# Patient Record
Sex: Female | Born: 1955 | Race: Black or African American | Hispanic: No | Marital: Single | Smoking: Current every day smoker
Health system: Southern US, Community
[De-identification: ages and names within clinical notes are randomized; demographics above are authoritative.]

## PROBLEM LIST (undated history)

## (undated) DIAGNOSIS — IMO0001 Reserved for inherently not codable concepts without codable children: Secondary | ICD-10-CM

## (undated) DIAGNOSIS — R030 Elevated blood-pressure reading, without diagnosis of hypertension: Secondary | ICD-10-CM

## (undated) DIAGNOSIS — F17200 Nicotine dependence, unspecified, uncomplicated: Secondary | ICD-10-CM

## (undated) DIAGNOSIS — E785 Hyperlipidemia, unspecified: Secondary | ICD-10-CM

## (undated) DIAGNOSIS — I10 Essential (primary) hypertension: Secondary | ICD-10-CM

## (undated) HISTORY — DX: Nicotine dependence, unspecified, uncomplicated: F17.200

## (undated) HISTORY — DX: Elevated blood-pressure reading, without diagnosis of hypertension: R03.0

## (undated) HISTORY — DX: Reserved for inherently not codable concepts without codable children: IMO0001

## (undated) HISTORY — DX: Hyperlipidemia, unspecified: E78.5

---
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
## 2008-05-08 ENCOUNTER — Ambulatory Visit: Payer: Self-pay

## 2009-09-10 IMAGING — US US PELV - US TRANSVAGINAL
1 series · 17 of 25 positions shown · non-contrast
Comparison: none

REASON FOR EXAM: pelvic pain Hx of fibroids
COMMENTS:

[Series 1: us pelv - us transvaginal · 17 of 50 slices shown]
[im 1/50]
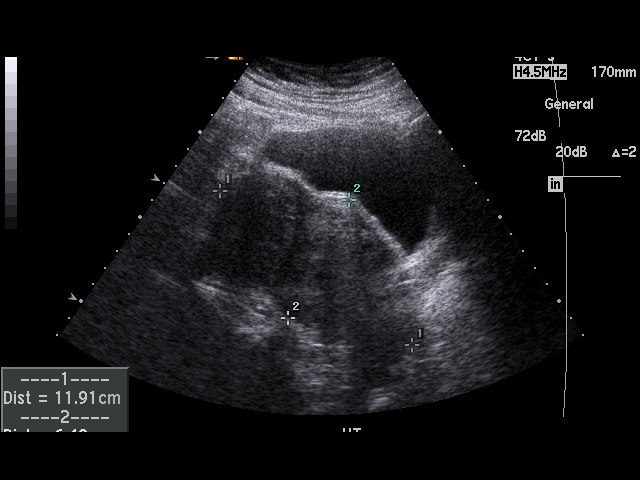
[im 5/50]
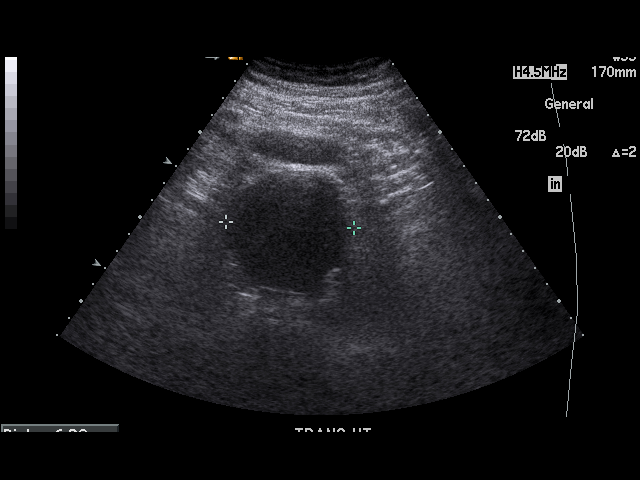
[im 7/50]
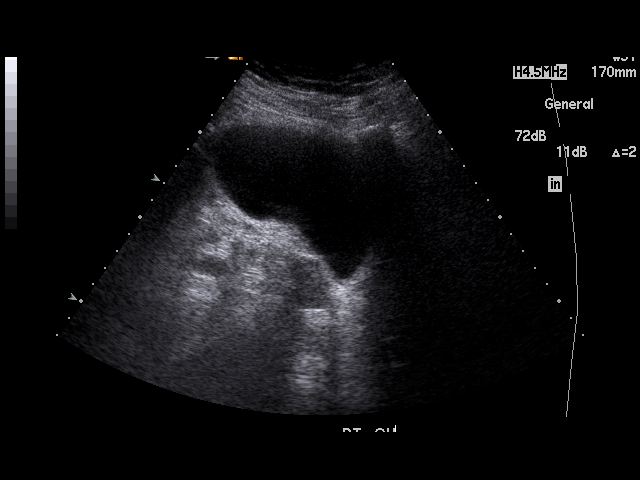
[im 11/50]
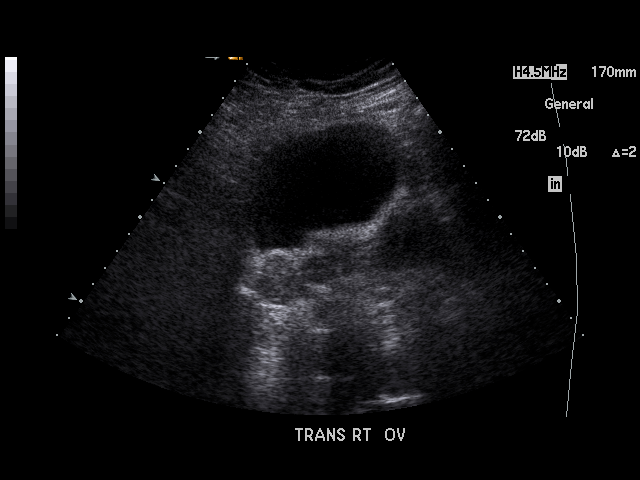
[im 13/50]
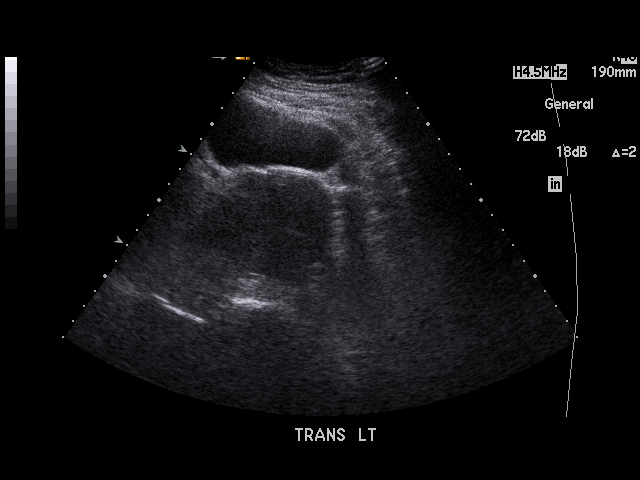
[im 17/50]
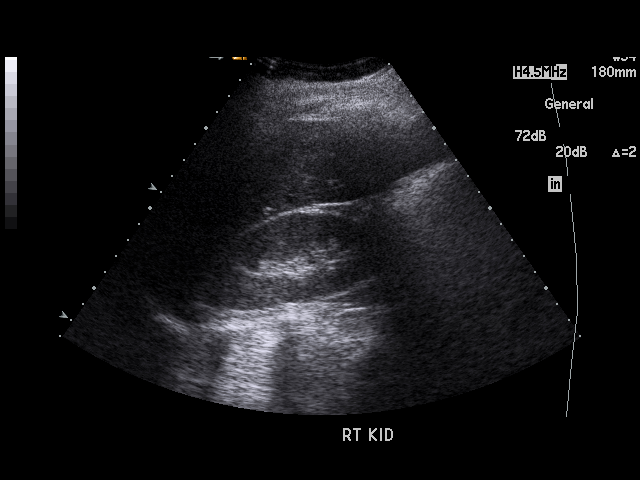
[im 19/50]
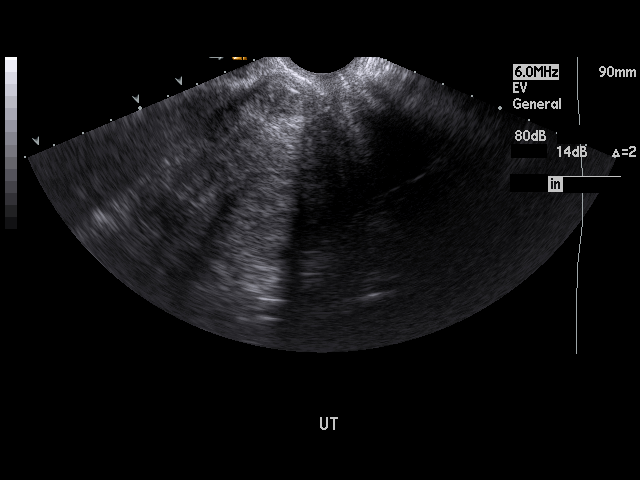
[im 23/50]
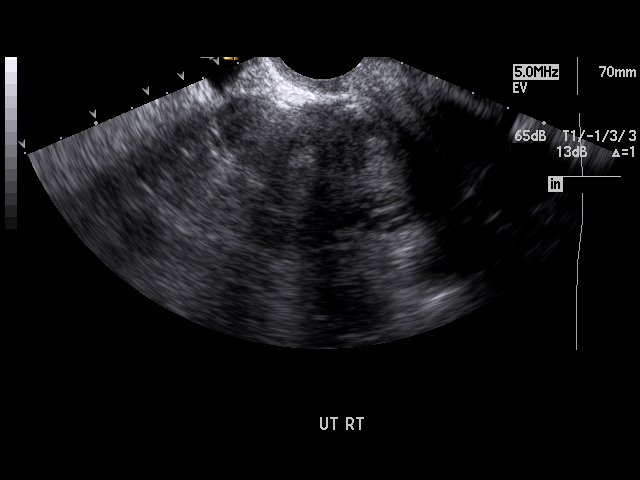
[im 25/50]
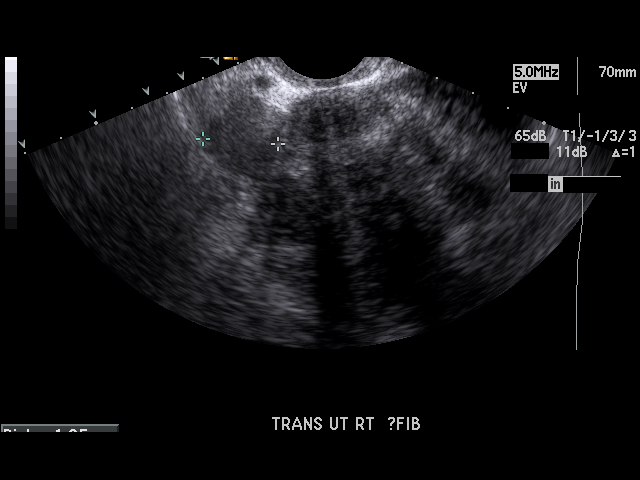
[im 27/50]
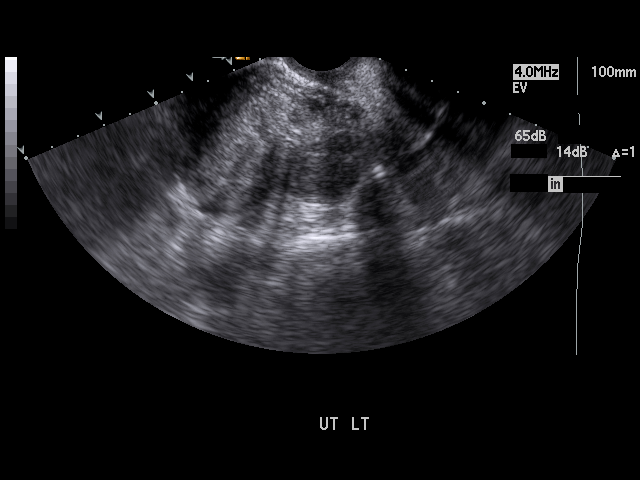
[im 31/50]
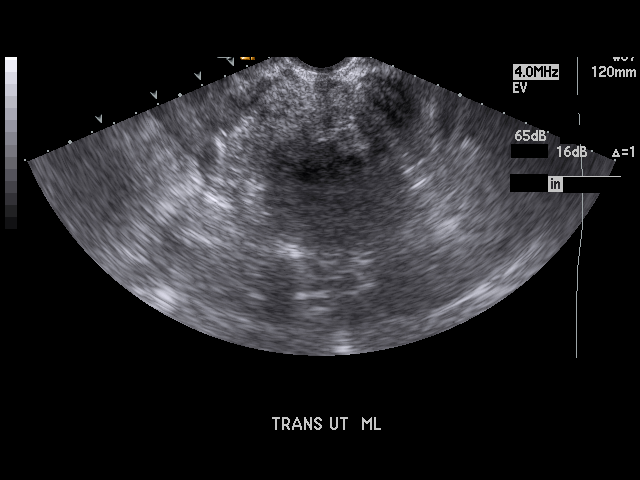
[im 33/50]
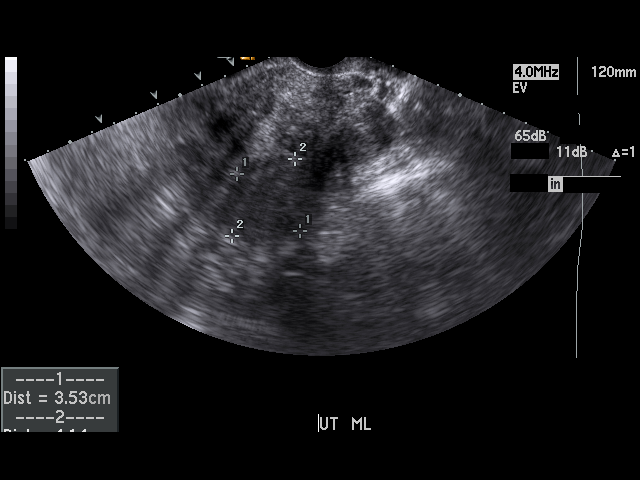
[im 37/50]
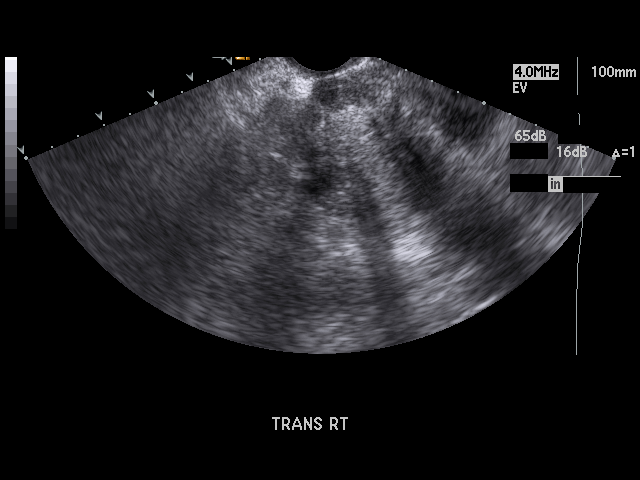
[im 39/50]
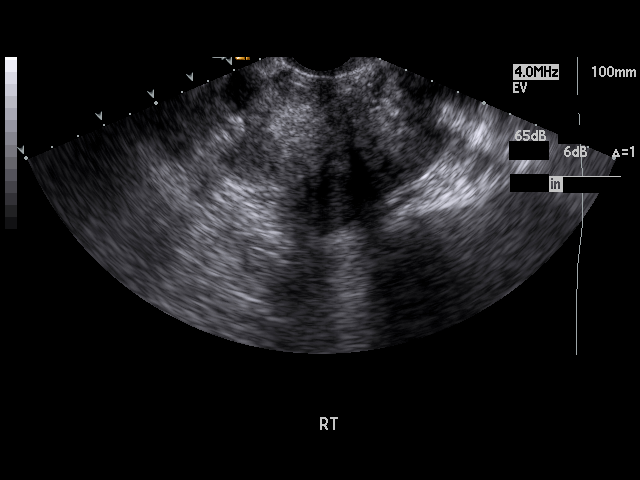
[im 43/50]
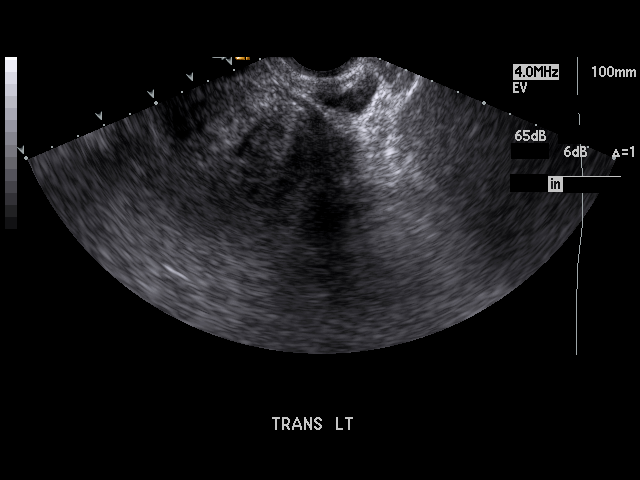
[im 45/50]
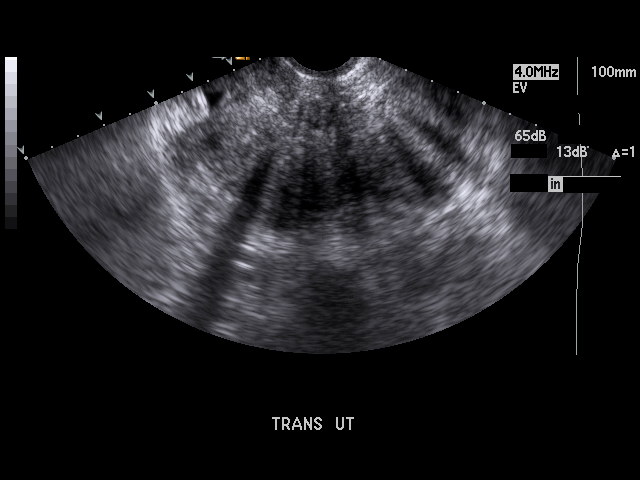
[im 50/50]
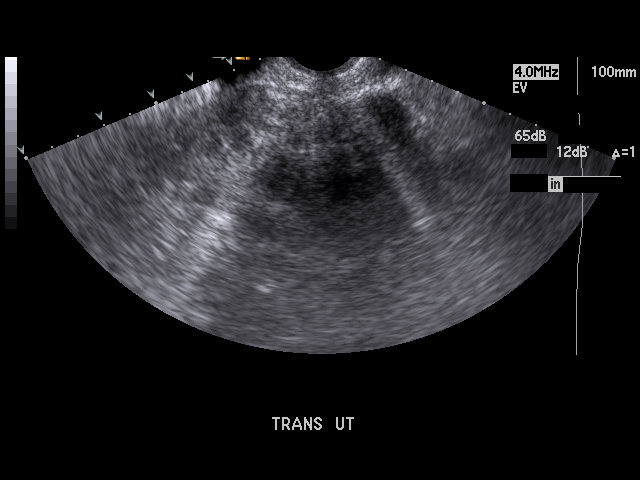

[17 of 25 positions shown; findings below may reference images not displayed]

PROCEDURE:     US  - US PELVIS MASS EXAM W/TRANSVAGI  - May 08, 2008  [DATE]

RESULT:     Transabdominal and endovaginal exam was performed.  The uterus
measures 11.91 cm x 8.67 cm x 6.43 cm. The endometrial stripe is not seen.
There are noted at least three uterine masses consistent with uterine
fibroids. The largest mass is in the fundal area anteriorly and measures
cm at maximum diameter. There appear to be additional masses measuring
cm at maximum diameter and 2.53 cm at maximum diameter consistent with
additional fibroids. The LEFT ovary is not identified. The RIGHT ovary is
not definitely seen but there is a hypoechoic area in the RIGHT adnexal area
measuring 2.55 cm at maximum diameter which may represent the RIGHT ovary
but this is not definite. No free fluid is seen in the pelvis. The
visualized portion of the urinary bladder is normal in appearance. The
kidneys show no hydronephrosis.
IMPRESSION: 1. There are multiple uterine masses consistent with uterine fibroids. The
largest is at the uterine fundus and measures 6.2 cm at maximum diameter.
2. The endometrial stripe is not identified in this patient.
3. The ovaries are not definitely seen.
4. There is no ascites.

## 2012-07-10 ENCOUNTER — Ambulatory Visit: Payer: Self-pay | Admitting: Physician Assistant

## 2012-08-31 ENCOUNTER — Ambulatory Visit: Payer: Self-pay | Admitting: Internal Medicine

## 2013-11-12 IMAGING — US US EXTREM UP VENOUS*L*
1 series · 14 of 24 positions shown · non-contrast
Comparison: none

REASON FOR EXAM: lt arm pain thrombophlebitis   eval DVT
COMMENTS:

PROCEDURE:     US  - US DOPPLER UP EXTR LEFT  - July 10, 2012 [DATE]
RESULT:     Comparison: None
TECHNIQUE: Multiple gray-scale, color-flow Doppler, and spectral waveform
tracings of the left internal jugular vein, brachiocephalic vein, and
proximal deep veins of the left upper extremity from the antecubital fossa
to the brachiocephalic vein are presented for review.

[Series 1: us extrem up venous*left* · 0.09mm/px · 14 of 39 slices shown]
[im 1/39]
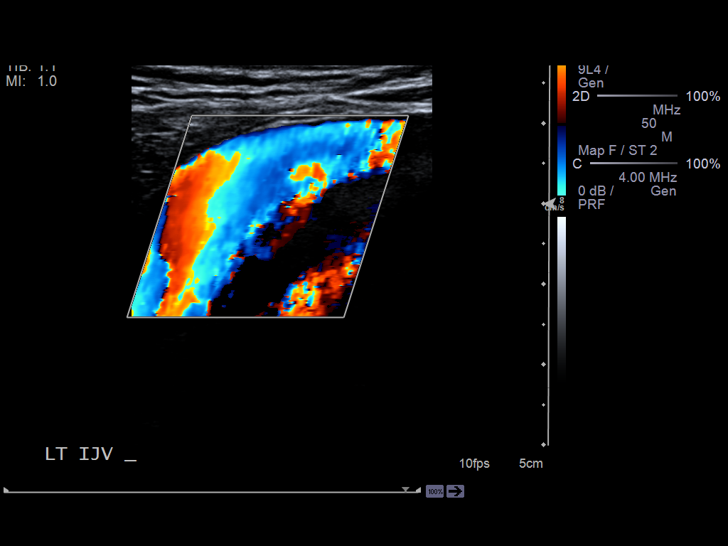
[im 4/39]
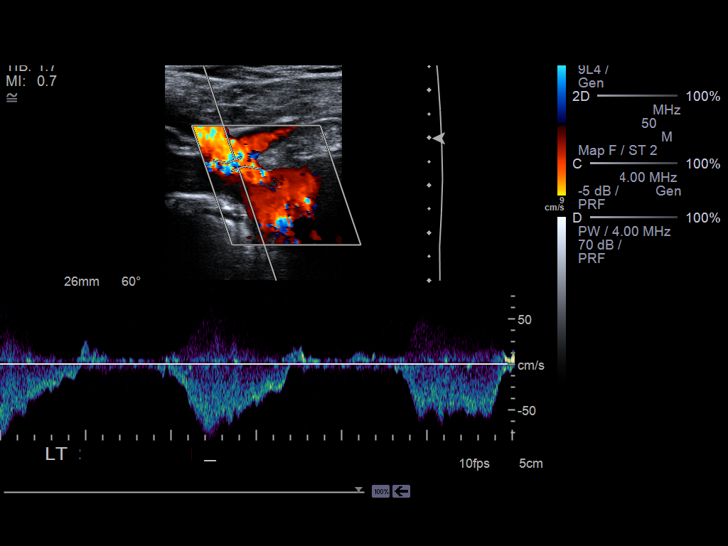
[im 7/39]
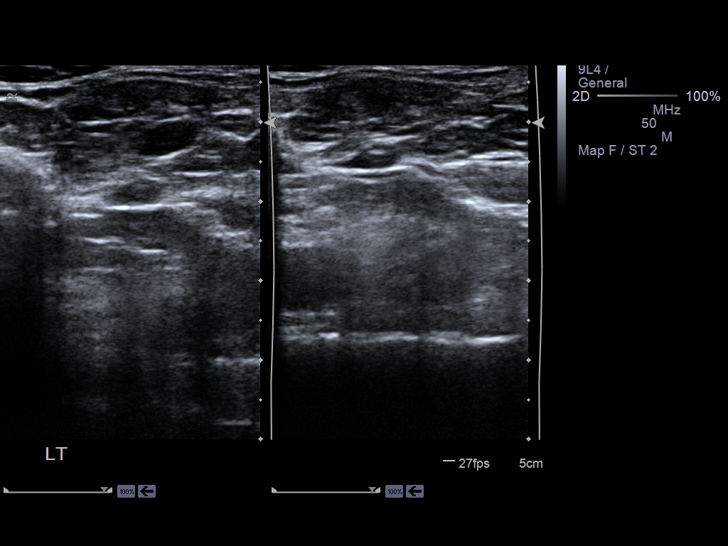
[im 10/39]
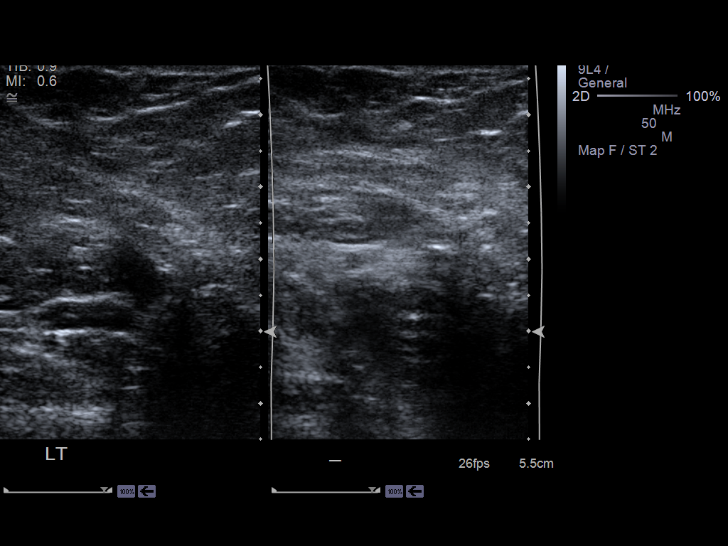
[im 12/39]
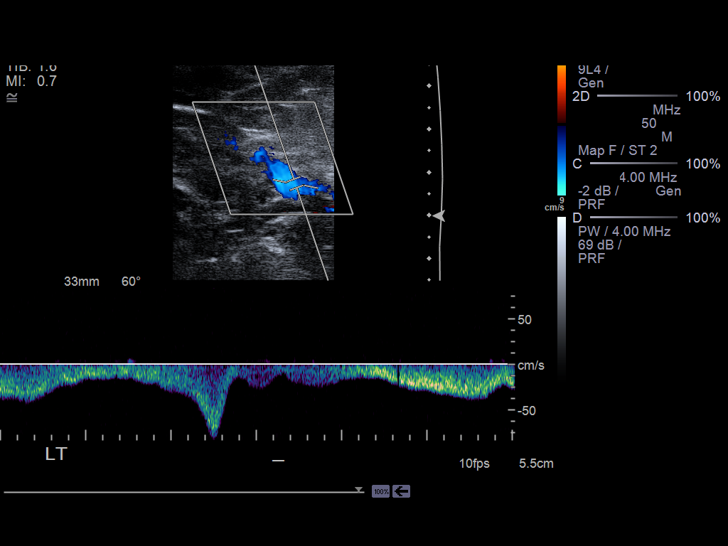
[im 15/39]
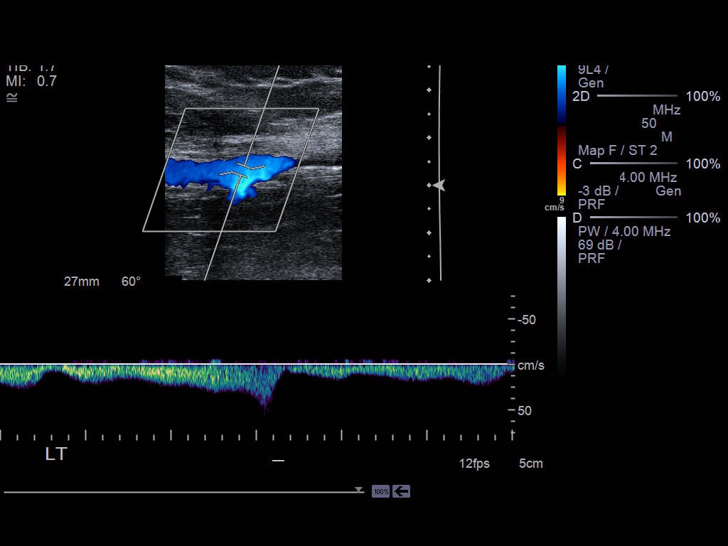
[im 19/39]
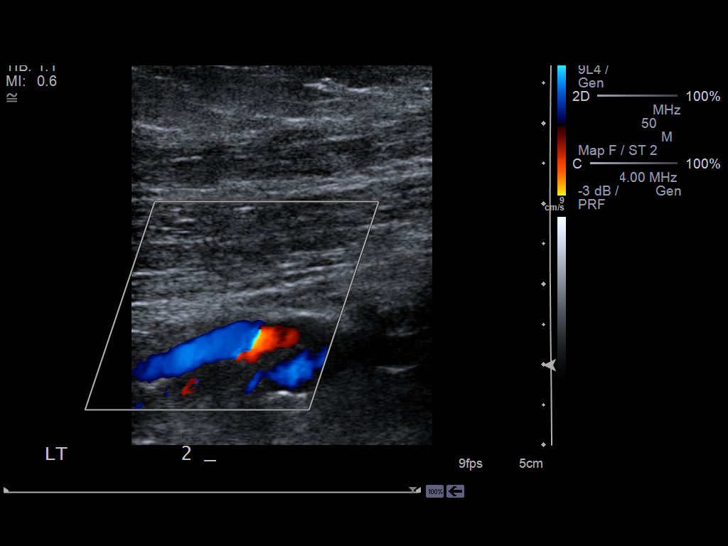
[im 20/39]
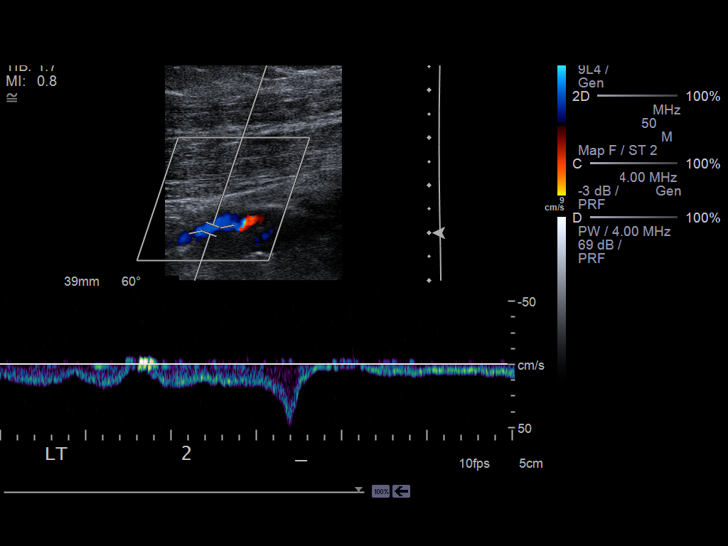
[im 24/39]
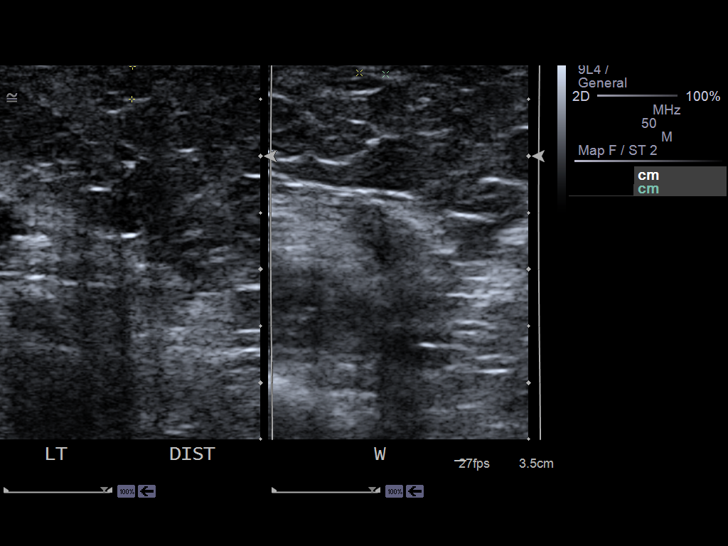
[im 27/39]
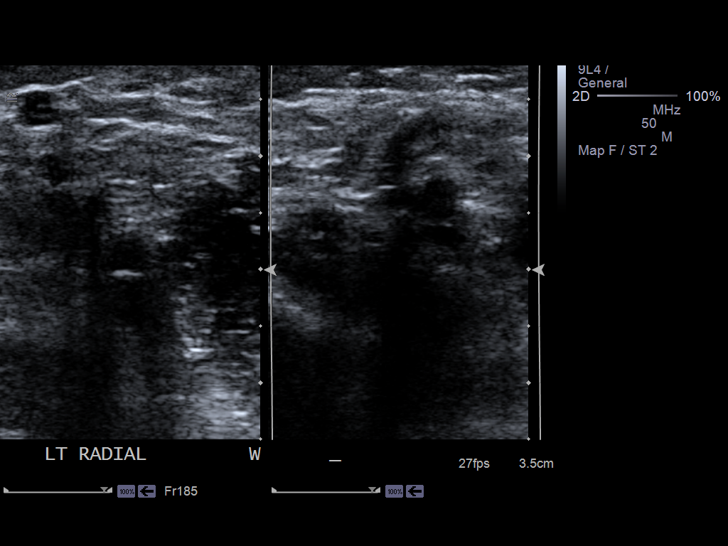
[im 30/39]
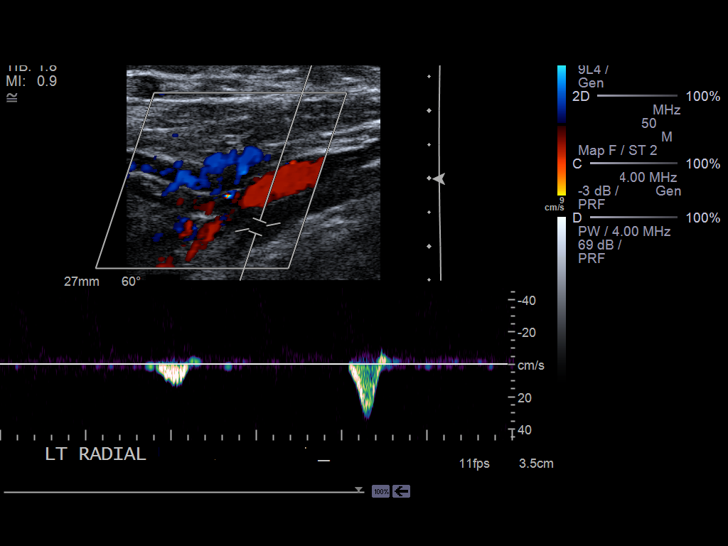
[im 32/39]
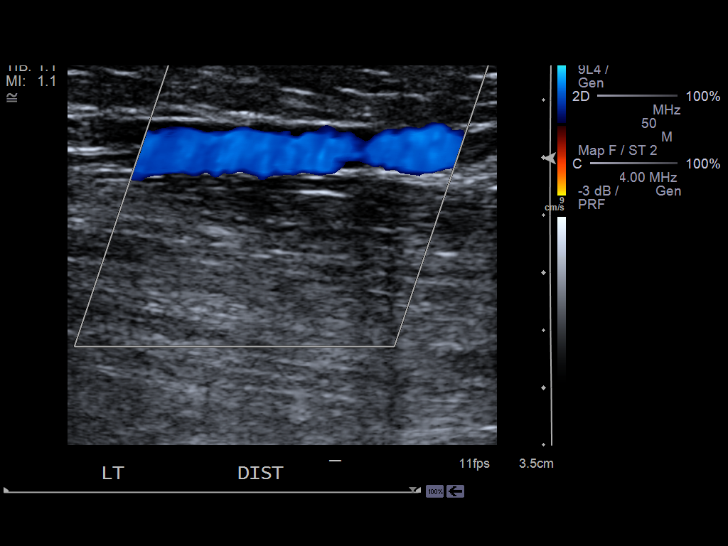
[im 35/39]
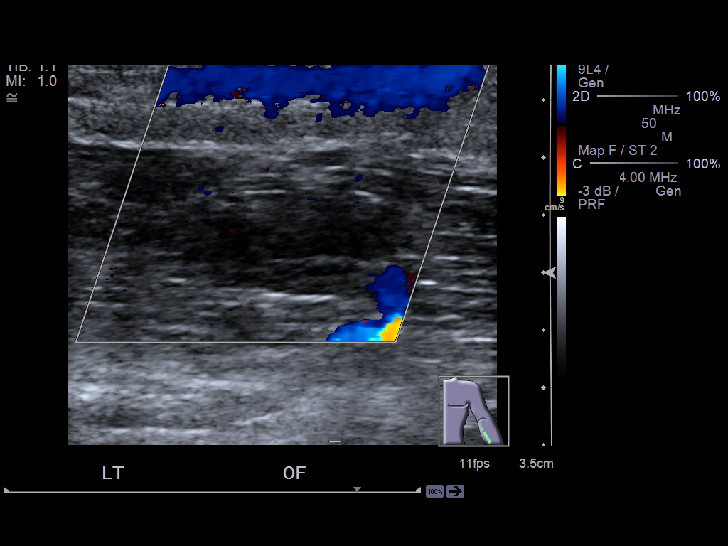
[im 39/39]
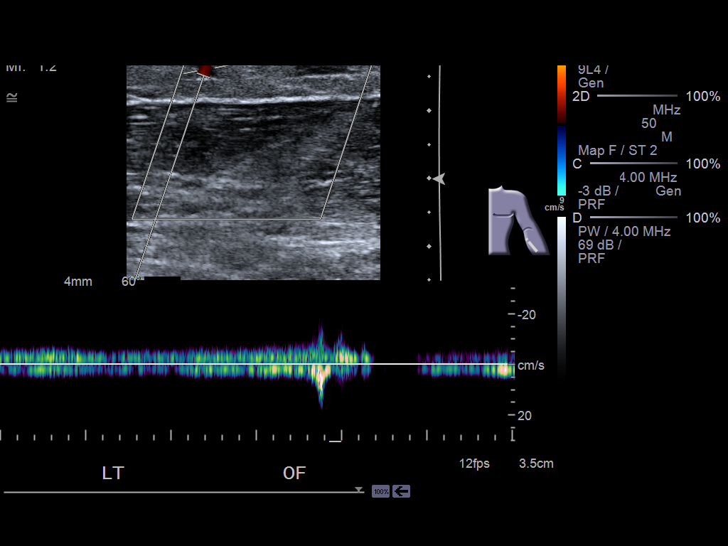

[14 of 24 positions shown; findings below may reference images not displayed]

FINDINGS: There is normal blood flow within the left internal jugular vein, visualized
portions of the brachiocephalic vein, and left upper extremity deep venous
system from the antecubital fossa to the brachiocephalic vein.

Limited images were obtained of the soft tissues of the medial aspect of the
forearm in the region of reported palpable amount he. No mass is identified.
IMPRESSION: No sonographic evidence of deep venous thrombosis in the left upper
extremity.

[REDACTED]

## 2014-04-05 DIAGNOSIS — R030 Elevated blood-pressure reading, without diagnosis of hypertension: Secondary | ICD-10-CM

## 2014-04-05 HISTORY — DX: Elevated blood-pressure reading, without diagnosis of hypertension: R03.0

## 2016-01-26 ENCOUNTER — Ambulatory Visit (INDEPENDENT_AMBULATORY_CARE_PROVIDER_SITE_OTHER): Payer: 59 | Admitting: Internal Medicine

## 2016-01-26 ENCOUNTER — Encounter: Payer: Self-pay | Admitting: Internal Medicine

## 2016-01-26 VITALS — BP 140/84 | HR 68 | Resp 16 | Ht 64.0 in | Wt 177.6 lb

## 2016-01-26 DIAGNOSIS — F17200 Nicotine dependence, unspecified, uncomplicated: Secondary | ICD-10-CM | POA: Diagnosis not present

## 2016-01-26 DIAGNOSIS — Z6830 Body mass index (BMI) 30.0-30.9, adult: Secondary | ICD-10-CM | POA: Diagnosis not present

## 2016-01-26 MED ORDER — VARENICLINE TARTRATE 1 MG PO TABS: 1.0000 mg | ORAL_TABLET | Freq: Two times a day (BID) | ORAL | 3 refills | Status: DC

## 2016-01-26 MED ORDER — VARENICLINE TARTRATE 0.5 MG X 11 & 1 MG X 42 PO MISC: ORAL | 0 refills | Status: DC

## 2016-01-26 NOTE — Progress Notes (Signed)
    Date:  01/26/2016   Name:  Shannon Barton   DOB:  [birth year]   MRN:  [id number]   Chief Complaint: Nicotine Dependence (Quit smoking in July 2017 but started back and now work needs her to reach a goal in order to get benefits credit. ) and Weight Loss (Needs to improve BMI for work benefits ) Nicotine Dependence  Presents for follow-up visit. Symptoms are negative for fatigue. Her urge triggers include company of smokers. The symptoms have been improving. She smokes 1 pack of cigarettes per day. Compliance with prior treatments has been good.   Overweight - needs to meet BMI goal of < 29.8.  She has already started working on weight loss and has lost a few pounds. She has a sedentary job and tends to snack so has cut out chips.  Wt Readings from Last 3 Encounters:  01/26/16 177 lb 9.6 oz (80.6 kg)     Review of Systems  Constitutional: Negative for chills, fatigue and fever.  Respiratory: Negative for cough, chest tightness, shortness of breath and wheezing.   Cardiovascular: Positive for leg swelling. Negative for chest pain and palpitations.  Gastrointestinal: Negative for abdominal pain.  Musculoskeletal: Negative for arthralgias and gait problem.    There are no active problems to display for this patient.   Prior to Admission medications   Not on File    No Known Allergies  Past Surgical History:  Procedure Laterality Date  . TONSILLECTOMY  1963    Social History  Substance Use Topics  . Smoking status: Current Every Day Smoker    Packs/day: 1.00  . Smokeless tobacco: Never Used  . Alcohol use No     Medication list has been reviewed and updated.   Physical Exam  Constitutional: She is oriented to person, place, and time. She appears well-developed. No distress.  HENT:  Head: Normocephalic and atraumatic.  Cardiovascular: Normal rate, regular rhythm and normal heart sounds.   Pulmonary/Chest: Effort normal and breath sounds normal. No respiratory  distress.  Musculoskeletal: Normal range of motion. She exhibits edema (trace ankle).  Neurological: She is alert and oriented to person, place, and time.  Skin: Skin is warm and dry. No rash noted.  Psychiatric: She has a normal mood and affect. Her speech is normal and behavior is normal. Thought content normal.  Nursing note and vitals reviewed.   BP 140/84   Pulse 68   Resp 16   Ht 5\' 4"  (1.626 m)   Wt 177 lb 9.6 oz (80.6 kg)   SpO2 98%   BMI 30.48 kg/m   Assessment and Plan: 1. Tobacco use disorder Rx given with counselling and savings card - Nurse to provide smoking / tobacco cessation education - varenicline (CHANTIX STARTING MONTH PAK) 0.5 MG X 11 & 1 MG X 42 tablet; Take one 0.5 mg tablet by mouth once daily for 3 days, then increase to one 0.5 mg tablet twice daily for 4 days, then increase to one 1 mg tablet twice daily.  Dispense: 53 tablet; Refill: 0 - varenicline (CHANTIX) 1 MG tablet; Take 1 tablet (1 mg total) by mouth 2 (two) times daily.  Dispense: 60 tablet; Refill: 3  2. BMI 30.0-30.9,adult Goal to lose 5 lbs to achieve BMI < 29.8  3. HM Patient urged to schedule CPX  Halina Maidens, MD [facility]  01/26/2016

## 2016-06-13 ENCOUNTER — Encounter: Payer: Self-pay | Admitting: Internal Medicine

## 2016-06-13 DIAGNOSIS — Z6830 Body mass index (BMI) 30.0-30.9, adult: Secondary | ICD-10-CM | POA: Insufficient documentation

## 2016-06-13 DIAGNOSIS — F17200 Nicotine dependence, unspecified, uncomplicated: Secondary | ICD-10-CM | POA: Insufficient documentation

## 2016-06-14 ENCOUNTER — Ambulatory Visit (INDEPENDENT_AMBULATORY_CARE_PROVIDER_SITE_OTHER): Payer: 59 | Admitting: Internal Medicine

## 2016-06-14 ENCOUNTER — Encounter: Payer: Self-pay | Admitting: Internal Medicine

## 2016-06-14 VITALS — BP 130/76 | HR 92 | Ht 64.0 in | Wt 176.4 lb

## 2016-06-14 DIAGNOSIS — Z1211 Encounter for screening for malignant neoplasm of colon: Secondary | ICD-10-CM | POA: Diagnosis not present

## 2016-06-14 DIAGNOSIS — F17200 Nicotine dependence, unspecified, uncomplicated: Secondary | ICD-10-CM

## 2016-06-14 DIAGNOSIS — Z0000 Encounter for general adult medical examination without abnormal findings: Secondary | ICD-10-CM

## 2016-06-14 DIAGNOSIS — Z1231 Encounter for screening mammogram for malignant neoplasm of breast: Secondary | ICD-10-CM | POA: Diagnosis not present

## 2016-06-14 DIAGNOSIS — Z1239 Encounter for other screening for malignant neoplasm of breast: Secondary | ICD-10-CM

## 2016-06-14 DIAGNOSIS — Z1159 Encounter for screening for other viral diseases: Secondary | ICD-10-CM | POA: Insufficient documentation

## 2016-06-14 LAB — POCT URINALYSIS DIPSTICK
BILIRUBIN UA: NEGATIVE
GLUCOSE UA: NEGATIVE
Ketones, UA: NEGATIVE
Leukocytes, UA: NEGATIVE
Nitrite, UA: NEGATIVE
Protein, UA: 300
RBC UA: NEGATIVE
SPEC GRAV UA: 1.015
UROBILINOGEN UA: 0.2
pH, UA: 6

## 2016-06-14 NOTE — Progress Notes (Signed)
Date:  06/14/2016   Name:  Shannon Barton   DOB:  1956-02-23   MRN:  [id number]   Chief Complaint: Annual Exam (w/ pap. She does not have GYN doctor. Wants Mammo and Colonoscopy done.) Shannon Barton is a 61 y.o. female who presents today for her Complete Annual Exam. She feels well. She reports exercising walking. She reports she is sleeping well. She has no breast complaints. She has cut back on smoking - less than one per day now.  Wt Readings from Last 3 Encounters:  06/14/16 176 lb 6.4 oz (80 kg)  01/26/16 177 lb 9.6 oz (80.6 kg)     Review of Systems  Constitutional: Negative for chills, fatigue and fever.  HENT: Negative for congestion, hearing loss, tinnitus, trouble swallowing and voice change.   Eyes: Negative for visual disturbance.  Respiratory: Negative for cough, chest tightness, shortness of breath and wheezing.   Cardiovascular: Negative for chest pain, palpitations and leg swelling.  Gastrointestinal: Negative for abdominal pain, constipation, diarrhea and vomiting.  Endocrine: Negative for polydipsia and polyuria.  Genitourinary: Negative for dysuria, frequency, genital sores, vaginal bleeding and vaginal discharge.  Musculoskeletal: Negative for arthralgias, gait problem and joint swelling.  Skin: Negative for color change and rash.  Neurological: Negative for dizziness, tremors, light-headedness and headaches.  Hematological: Negative for adenopathy. Does not bruise/bleed easily.  Psychiatric/Behavioral: Negative for dysphoric mood and sleep disturbance. The patient is not nervous/anxious.     Patient Active Problem List   Diagnosis Date Noted  . Tobacco use disorder 06/13/2016  . BMI 30.0-30.9,adult 06/13/2016    Prior to Admission medications   Medication Sig Start Date End Date Taking? Authorizing Provider  varenicline (CHANTIX STARTING MONTH PAK) 0.5 MG X 11 & 1 MG X 42 tablet Take one 0.5 mg tablet by mouth once daily for 3 days, then  increase to one 0.5 mg tablet twice daily for 4 days, then increase to one 1 mg tablet twice daily. Patient not taking: Reported on 06/14/2016 01/26/16   Glean Hess, MD  varenicline (CHANTIX) 1 MG tablet Take 1 tablet (1 mg total) by mouth 2 (two) times daily. Patient not taking: Reported on 06/14/2016 01/26/16   Glean Hess, MD    No Known Allergies  Past Surgical History:  Procedure Laterality Date  . TONSILLECTOMY  1963    Social History  Substance Use Topics  . Smoking status: Current Every Day Smoker    Packs/day: 1.00  . Smokeless tobacco: Never Used  . Alcohol use No     Medication list has been reviewed and updated.   Physical Exam  Constitutional: She is oriented to person, place, and time. She appears well-developed and well-nourished. No distress.  HENT:  Head: Normocephalic and atraumatic.  Right Ear: Tympanic membrane and ear canal normal.  Left Ear: Tympanic membrane and ear canal normal.  Nose: Right sinus exhibits no maxillary sinus tenderness. Left sinus exhibits no maxillary sinus tenderness.  Mouth/Throat: Uvula is midline and oropharynx is clear and moist.  Eyes: Conjunctivae and EOM are normal. Right eye exhibits no discharge. Left eye exhibits no discharge. No scleral icterus.  Neck: Normal range of motion. Carotid bruit is not present. No erythema present. No thyromegaly present.  Cardiovascular: Normal rate, regular rhythm, normal heart sounds and normal pulses.   Pulmonary/Chest: Effort normal. No respiratory distress. She has no wheezes. Right breast exhibits no mass, no nipple discharge, no skin change and no tenderness. Left breast exhibits no  mass, no nipple discharge, no skin change and no tenderness.  Abdominal: Soft. Bowel sounds are normal. There is no hepatosplenomegaly. There is no tenderness. There is no CVA tenderness.  Genitourinary: Vagina normal and uterus normal. There is no tenderness, lesion or injury on the right labia. There  is no tenderness, lesion or injury on the left labia. Cervix exhibits no motion tenderness, no discharge and no friability. Right adnexum displays no mass, no tenderness and no fullness. Left adnexum displays no mass, no tenderness and no fullness.  Musculoskeletal: Normal range of motion.  Lymphadenopathy:    She has no cervical adenopathy.    She has no axillary adenopathy.  Neurological: She is alert and oriented to person, place, and time. She has normal reflexes. No cranial nerve deficit or sensory deficit.  Skin: Skin is warm, dry and intact. No rash noted.  Psychiatric: She has a normal mood and affect. Her speech is normal and behavior is normal. Thought content normal.  Nursing note and vitals reviewed.   BP 130/76   Pulse 92   Ht 5\' 4"  (1.626 m)   Wt 176 lb 6.4 oz (80 kg)   SpO2 99%   BMI 30.28 kg/m   Assessment and Plan: 1. Annual physical exam Recommend healthy diet and regular exercise - CBC with Differential/Platelet - Comprehensive metabolic panel - Lipid panel - TSH - POCT urinalysis dipstick - Pap IG and HPV (high risk) DNA detection  2. Breast cancer screening - MM DIGITAL SCREENING BILATERAL; Future  3. Tobacco use disorder Congratulated on almost quitting  4. Colon cancer screening - Ambulatory referral to Gastroenterology   No orders of the defined types were placed in this encounter.   Halina Maidens, MD [facility]  06/14/2016

## 2016-06-14 NOTE — Patient Instructions (Signed)
Breast Self-Awareness Breast self-awareness means being familiar with how your breasts look and feel. It involves checking your breasts regularly and reporting any changes to your health care provider. Practicing breast self-awareness is important. A change in your breasts can be a sign of a serious medical problem. Being familiar with how your breasts look and feel allows you to find any problems early, when treatment is more likely to be successful. All women should practice breast self-awareness, including women who have had breast implants. How to do a breast self-exam One way to learn what is normal for your breasts and whether your breasts are changing is to do a breast self-exam. To do a breast self-exam: Look for Changes   1. Remove all the clothing above your waist. 2. Stand in front of a mirror in a room with good lighting. 3. Put your hands on your hips. 4. Push your hands firmly downward. 5. Compare your breasts in the mirror. Look for differences between them (asymmetry), such as:  Differences in shape.  Differences in size.  Puckers, dips, and bumps in one breast and not the other. 6. Look at each breast for changes in your skin, such as:  Redness.  Scaly areas. 7. Look for changes in your nipples, such as:  Discharge.  Bleeding.  Dimpling.  Redness.  A change in position. Feel for Changes   Carefully feel your breasts for lumps and changes. It is best to do this while lying on your back on the floor and again while sitting or standing in the shower or tub with soapy water on your skin. Feel each breast in the following way:  Place the arm on the side of the breast you are examining above your head.  Feel your breast with the other hand.  Start in the nipple area and make  inch (2 cm) overlapping circles to feel your breast. Use the pads of your three middle fingers to do this. Apply light pressure, then medium pressure, then firm pressure. The light pressure  will allow you to feel the tissue closest to the skin. The medium pressure will allow you to feel the tissue that is a little deeper. The firm pressure will allow you to feel the tissue close to the ribs.  Continue the overlapping circles, moving downward over the breast until you feel your ribs below your breast.  Move one finger-width toward the center of the body. Continue to use the  inch (2 cm) overlapping circles to feel your breast as you move slowly up toward your collarbone.  Continue the up and down exam using all three pressures until you reach your armpit. Write Down What You Find   Write down what is normal for each breast and any changes that you find. Keep a written record with breast changes or normal findings for each breast. By writing this information down, you do not need to depend only on memory for size, tenderness, or location. Write down where you are in your menstrual cycle, if you are still menstruating. If you are having trouble noticing differences in your breasts, do not get discouraged. With time you will become more familiar with the variations in your breasts and more comfortable with the exam. How often should I examine my breasts? Examine your breasts every month. If you are breastfeeding, the best time to examine your breasts is after a feeding or after using a breast pump. If you menstruate, the best time to examine your breasts is 5-7 days  after your period is over. During your period, your breasts are lumpier, and it may be more difficult to notice changes. When should I see my health care provider? See your health care provider if you notice:  A change in shape or size of your breasts or nipples.  A change in the skin of your breast or nipples, such as a reddened or scaly area.  Unusual discharge from your nipples.  A lump or thick area that was not there before.  Pain in your breasts.  Anything that concerns you. This information is not intended to  replace advice given to you by your health care provider. Make sure you discuss any questions you have with your health care provider. Document Released: 03/22/2005 Document Revised: 08/28/2015 Document Reviewed: 02/09/2015 Elsevier Interactive Patient Education  2017 [employer].

## 2016-06-17 LAB — PAP IG AND HPV HIGH-RISK
HPV, high-risk: NEGATIVE
PAP SMEAR COMMENT: 0

## 2016-06-18 LAB — COMPREHENSIVE METABOLIC PANEL
ALBUMIN: 4.1 g/dL (ref 3.5–5.5)
ALT: 11 IU/L (ref 0–32)
AST: 18 IU/L (ref 0–40)
Albumin/Globulin Ratio: 1.2 (ref 1.2–2.2)
Alkaline Phosphatase: 79 IU/L (ref 39–117)
BUN / CREAT RATIO: 18 (ref 9–23)
BUN: 13 mg/dL (ref 6–24)
Bilirubin Total: <0.2 mg/dL (ref 0.0–1.2)
CALCIUM: 9.8 mg/dL (ref 8.7–10.2)
CHLORIDE: 98 mmol/L (ref 96–106)
CO2: 23 mmol/L (ref 18–29)
Creatinine, Ser: 0.71 mg/dL (ref 0.57–1.00)
GFR, EST AFRICAN AMERICAN: 108 mL/min/{1.73_m2} (ref >59)
GFR, EST NON AFRICAN AMERICAN: 94 mL/min/{1.73_m2} (ref >59)
Globulin, Total: 3.5 g/dL (ref 1.5–4.5)
Glucose: 106 mg/dL — ABNORMAL HIGH (ref 65–99)
POTASSIUM: 4.8 mmol/L (ref 3.5–5.2)
Sodium: 140 mmol/L (ref 134–144)
TOTAL PROTEIN: 7.6 g/dL (ref 6.0–8.5)

## 2016-06-18 LAB — CBC WITH DIFFERENTIAL/PLATELET
BASOS: 0 %
Basophils Absolute: 0 10*3/uL (ref 0.0–0.2)
EOS (ABSOLUTE): 0.2 10*3/uL (ref 0.0–0.4)
EOS: 2 %
HEMATOCRIT: 43 % (ref 34.0–46.6)
HEMOGLOBIN: 14.4 g/dL (ref 11.1–15.9)
IMMATURE GRANS (ABS): 0 10*3/uL (ref 0.0–0.1)
IMMATURE GRANULOCYTES: 0 %
LYMPHS: 23 %
Lymphocytes Absolute: 2.5 10*3/uL (ref 0.7–3.1)
MCH: 29.3 pg (ref 26.6–33.0)
MCHC: 33.5 g/dL (ref 31.5–35.7)
MCV: 88 fL (ref 79–97)
MONOCYTES: 9 %
Monocytes Absolute: 0.9 10*3/uL (ref 0.1–0.9)
NEUTROS ABS: 7.1 10*3/uL — AB (ref 1.4–7.0)
NEUTROS PCT: 66 %
PLATELETS: 318 10*3/uL (ref 150–379)
RBC: 4.91 x10E6/uL (ref 3.77–5.28)
RDW: 13.5 % (ref 12.3–15.4)
WBC: 10.8 10*3/uL (ref 3.4–10.8)

## 2016-06-18 LAB — TSH: TSH: 0.835 u[IU]/mL (ref 0.450–4.500)

## 2016-06-18 LAB — LIPID PANEL
Chol/HDL Ratio: 6.6 ratio units — ABNORMAL HIGH (ref 0.0–4.4)
Cholesterol, Total: 238 mg/dL — ABNORMAL HIGH (ref 100–199)
HDL: 36 mg/dL — AB (ref >39)
LDL Calculated: 186 mg/dL — ABNORMAL HIGH (ref 0–99)
Triglycerides: 80 mg/dL (ref 0–149)
VLDL CHOLESTEROL CAL: 16 mg/dL (ref 5–40)

## 2016-06-28 ENCOUNTER — Other Ambulatory Visit: Payer: Self-pay | Admitting: Internal Medicine

## 2016-06-28 ENCOUNTER — Telehealth: Payer: Self-pay

## 2016-06-28 MED ORDER — ATORVASTATIN CALCIUM 10 MG PO TABS: 10.0000 mg | ORAL_TABLET | Freq: Every day | ORAL | 3 refills | Status: DC

## 2016-06-28 NOTE — Telephone Encounter (Signed)
Spoke to pt about labs- would like to begin cholesterol medication and send to pharmacy- [facility] in [place]. Pt sent to front to schedule 4 month Follow up appt.

## 2016-06-28 NOTE — Telephone Encounter (Signed)
Done

## 2016-06-28 NOTE — Telephone Encounter (Signed)
Pt informed

## 2016-10-26 ENCOUNTER — Ambulatory Visit: Payer: 59 | Admitting: Internal Medicine

## 2016-11-10 ENCOUNTER — Ambulatory Visit (INDEPENDENT_AMBULATORY_CARE_PROVIDER_SITE_OTHER): Payer: 59 | Admitting: Internal Medicine

## 2016-11-10 VITALS — BP 132/82 | HR 65 | Ht 64.5 in | Wt 170.0 lb

## 2016-11-10 DIAGNOSIS — F17200 Nicotine dependence, unspecified, uncomplicated: Secondary | ICD-10-CM

## 2016-11-10 DIAGNOSIS — E782 Mixed hyperlipidemia: Secondary | ICD-10-CM | POA: Diagnosis not present

## 2016-11-10 DIAGNOSIS — R030 Elevated blood-pressure reading, without diagnosis of hypertension: Secondary | ICD-10-CM | POA: Diagnosis not present

## 2016-11-10 MED ORDER — SIMVASTATIN 10 MG PO TABS: 10.0000 mg | ORAL_TABLET | Freq: Every day | ORAL | 3 refills | Status: DC

## 2016-11-10 NOTE — Progress Notes (Signed)
Date:  11/10/2016   Name:  Shannon Barton   DOB:  05-Jan-1956   MRN:  [id number]   Chief Complaint: Hyperlipidemia (Patient quit taking atorvastatin 1 month after starting it. States she stopped it because she had "griping" cramps in her sides. ) and Hypertension Hyperlipidemia  This is a chronic problem. Recent lipid tests were reviewed and are high. Pertinent negatives include no chest pain or shortness of breath. Compliance problems include medication side effects (had RUQ abd pain on atorvastatin).   Hypertension  This is a new problem. The current episode started 1 to 4 weeks ago. Progression since onset: elevated BP at work biometric screening. Pertinent negatives include no chest pain, headaches, palpitations or shortness of breath.  Nicotine Dependence  Presents for follow-up visit. Symptoms are negative for fatigue. Her urge triggers include company of smokers. The symptoms have been improving. She smokes < 1/2 a pack (smoking only one or two cigarettes every other day) of cigarettes per day. Compliance with prior treatments: never tried chantix - was afraid of side effects.      Review of Systems  Constitutional: Negative for chills, fatigue, fever and unexpected weight change.  Respiratory: Negative for chest tightness, shortness of breath and wheezing.   Cardiovascular: Negative for chest pain, palpitations and leg swelling.  Gastrointestinal: Negative for abdominal distention, abdominal pain, constipation and diarrhea.  Neurological: Negative for dizziness and headaches.  Psychiatric/Behavioral: Negative for dysphoric mood. The patient is not nervous/anxious.     Patient Active Problem List   Diagnosis Date Noted  . Mixed hyperlipidemia 11/10/2016  . Elevated BP without diagnosis of hypertension 11/10/2016  . Need for hepatitis C screening test 06/14/2016  . Tobacco use disorder 06/13/2016  . BMI 30.0-30.9,adult 06/13/2016    Prior to Admission medications     Medication Sig Start Date End Date Taking? Authorizing Provider  atorvastatin (LIPITOR) 10 MG tablet Take 1 tablet (10 mg total) by mouth daily. Patient not taking: Reported on 11/10/2016 06/28/16   Glean Hess, MD  varenicline (CHANTIX STARTING MONTH PAK) 0.5 MG X 11 & 1 MG X 42 tablet Take one 0.5 mg tablet by mouth once daily for 3 days, then increase to one 0.5 mg tablet twice daily for 4 days, then increase to one 1 mg tablet twice daily. Patient not taking: Reported on 06/14/2016 01/26/16   Glean Hess, MD  varenicline (CHANTIX) 1 MG tablet Take 1 tablet (1 mg total) by mouth 2 (two) times daily. Patient not taking: Reported on 06/14/2016 01/26/16   Glean Hess, MD    No Known Allergies  Past Surgical History:  Procedure Laterality Date  . TONSILLECTOMY  1963    Social History  Substance Use Topics  . Smoking status: Current Every Day Smoker    Packs/day: 1.00  . Smokeless tobacco: Never Used  . Alcohol use No     Medication list has been reviewed and updated.   Physical Exam  Constitutional: She is oriented to person, place, and time. She appears well-developed. No distress.  HENT:  Head: Normocephalic and atraumatic.  Neck: Normal range of motion. Neck supple. Carotid bruit is not present.  Cardiovascular: Normal rate, regular rhythm and normal heart sounds.   Pulmonary/Chest: Effort normal and breath sounds normal. No respiratory distress. She has no wheezes. She has no rales.  Abdominal: Soft. Normal appearance and bowel sounds are normal. There is no tenderness.  Musculoskeletal: Normal range of motion. She exhibits edema (1+ lower tibia  bilaterally).  Neurological: She is alert and oriented to person, place, and time.  Skin: Skin is warm and dry. No rash noted.  Psychiatric: She has a normal mood and affect. Her speech is normal and behavior is normal. Thought content normal.  Nursing note and vitals reviewed.   BP 132/82   Pulse 65   Ht 5' 4.5"  (1.638 m)   Wt 170 lb (77.1 kg)   SpO2 98%   BMI 28.73 kg/m   Assessment and Plan: 1. Tobacco use disorder Pt encouraged to continue to cut back No indications for medication due to low frequency of use  2. Mixed hyperlipidemia Will try low dose simvastatin  3. Elevated BP without diagnosis of hypertension Pt educated regarding sodium intake Monitor BP and reassess next visit   Meds ordered this encounter  Medications  . simvastatin (ZOCOR) 10 MG tablet    Sig: Take 1 tablet (10 mg total) by mouth at bedtime.    Dispense:  30 tablet    Refill:  3    Discontinue atorvastatin    Halina Maidens, MD [facility]  11/11/2016

## 2016-11-11 ENCOUNTER — Encounter: Payer: Self-pay | Admitting: Internal Medicine

## 2017-06-16 ENCOUNTER — Encounter: Payer: 59 | Admitting: Internal Medicine

## 2017-09-07 ENCOUNTER — Encounter: Payer: Self-pay | Admitting: Surgery

## 2017-09-07 ENCOUNTER — Ambulatory Visit
Admission: RE | Admit: 2017-09-07 | Discharge: 2017-09-07 | Disposition: A | Discharge status: Home / Self Care | Payer: Managed Care, Other (non HMO) | Source: Ambulatory Visit | Attending: Surgery | Admitting: Surgery

## 2017-09-07 ENCOUNTER — Ambulatory Visit: Payer: Managed Care, Other (non HMO) | Admitting: Internal Medicine

## 2017-09-07 ENCOUNTER — Ambulatory Visit: Payer: Managed Care, Other (non HMO) | Admitting: Surgery

## 2017-09-07 ENCOUNTER — Other Ambulatory Visit: Payer: Self-pay | Admitting: Surgery

## 2017-09-07 ENCOUNTER — Encounter: Payer: Self-pay | Admitting: Internal Medicine

## 2017-09-07 VITALS — BP 118/82 | HR 77 | Temp 98.2°F | Resp 16 | Ht 64.0 in | Wt 177.0 lb

## 2017-09-07 VITALS — BP 165/91 | HR 82 | Temp 97.9°F | Ht 64.0 in | Wt 177.2 lb

## 2017-09-07 DIAGNOSIS — L03115 Cellulitis of right lower limb: Secondary | ICD-10-CM

## 2017-09-07 DIAGNOSIS — L02415 Cutaneous abscess of right lower limb: Secondary | ICD-10-CM

## 2017-09-07 DIAGNOSIS — M7989 Other specified soft tissue disorders: Secondary | ICD-10-CM

## 2017-09-07 DIAGNOSIS — W57XXXA Bitten or stung by nonvenomous insect and other nonvenomous arthropods, initial encounter: Secondary | ICD-10-CM | POA: Diagnosis not present

## 2017-09-07 MED ORDER — AMOXICILLIN-POT CLAVULANATE 875-125 MG PO TABS: 1.0000 | ORAL_TABLET | Freq: Two times a day (BID) | ORAL | 0 refills | Status: AC

## 2017-09-07 NOTE — Progress Notes (Signed)
Date:  09/07/2017   Name:  Shannon Barton   DOB:  [birth year]   MRN:  [id number]   Chief Complaint: Tick Removal (right leg causing blisters X 1 week) Pulled a tick off the back of her right knee last week.  The next day a blister had formed and her leg was swelling.  Went to [facility] - verified tick was gone and prescribed Doxycycline. Now the area has formed a larger firm lesion and leg is warm and still swollen.  There has been no drainage. She has one more day of antibiotics.   Review of Systems  Constitutional: Negative for chills, fatigue and fever.  Respiratory: Negative for chest tightness and shortness of breath.   Cardiovascular: Negative for palpitations and leg swelling.  Musculoskeletal: Negative for joint swelling.  Skin: Positive for color change and wound. Negative for rash.    Patient Active Problem List   Diagnosis Date Noted  . Mixed hyperlipidemia 11/10/2016  . Elevated BP without diagnosis of hypertension 11/10/2016  . Need for hepatitis C screening test 06/14/2016  . Tobacco use disorder 06/13/2016  . BMI 30.0-30.9,adult 06/13/2016    Prior to Admission medications   Medication Sig Start Date End Date Taking? Authorizing Provider  doxycycline (VIBRA-TABS) 100 MG tablet Take 100 mg by mouth every 12 (twelve) hours. 08/31/17  Yes [provider]  simvastatin (ZOCOR) 10 MG tablet Take 1 tablet (10 mg total) by mouth at bedtime. 11/10/16  Yes Glean Hess, MD    Allergies  Allergen Reactions  . Atorvastatin Other (See Comments)    Abdominal pain    Past Surgical History:  Procedure Laterality Date  . TONSILLECTOMY  1963    Social History   Tobacco Use  . Smoking status: Current Every Day Smoker    Packs/day: 1.00  . Smokeless tobacco: Never Used  Substance Use Topics  . Alcohol use: No  . Drug use: No     Medication list has been reviewed and updated.  Current Meds  Medication Sig  . doxycycline (VIBRA-TABS) 100 MG tablet  Take 100 mg by mouth every 12 (twelve) hours.  . simvastatin (ZOCOR) 10 MG tablet Take 1 tablet (10 mg total) by mouth at bedtime.    PHQ 2/9 Scores 01/26/2016  PHQ - 2 Score 0    Physical Exam  Constitutional: She is oriented to person, place, and time. She appears well-developed. No distress.  HENT:  Head: Normocephalic and atraumatic.  Cardiovascular: Normal rate, regular rhythm and normal heart sounds.  Pulmonary/Chest: Effort normal and breath sounds normal. No respiratory distress.  Musculoskeletal: Normal range of motion.  Neurological: She is alert and oriented to person, place, and time.  Skin: Skin is warm and dry. No rash noted.     Psychiatric: She has a normal mood and affect. Her behavior is normal. Thought content normal.  Nursing note and vitals reviewed.   BP 118/82   Pulse 77   Temp 98.2 F (36.8 C) (Oral)   Resp 16   Ht 5\' 4"  (1.626 m)   Wt 177 lb (80.3 kg)   SpO2 98%   BMI 30.38 kg/m   Assessment and Plan: 1. Cellulitis and abscess of right leg Need general surgery evaluation for possible I&D - amoxicillin-clavulanate (AUGMENTIN) 875-125 MG tablet; Take 1 tablet by mouth 2 (two) times daily for 10 days.  Dispense: 20 tablet; Refill: 0 - Ambulatory referral to General Surgery - seeing her today as a work in  Meds ordered this encounter  Medications  . amoxicillin-clavulanate (AUGMENTIN) 875-125 MG tablet    Sig: Take 1 tablet by mouth 2 (two) times daily for 10 days.    Dispense:  20 tablet    Refill:  0    Partially dictated using Editor, commissioning. Any errors are unintentional.  Halina Maidens, MD [facility]  09/07/2017   There are no diagnoses linked to this encounter.

## 2017-09-07 NOTE — Patient Instructions (Addendum)
Please pick up the antibiotic at your pharmacy and begin taking it today.  We need for you to go straight to the [facility] to have a stat ultrasound of your leg. Please see your follow up appointment listed below.

## 2017-09-08 ENCOUNTER — Encounter: Payer: Self-pay | Admitting: Surgery

## 2017-09-08 ENCOUNTER — Telehealth: Payer: Self-pay

## 2017-09-08 NOTE — Telephone Encounter (Signed)
Patient called wanting her U/S results and I told her that it showed some inflammation on the area but that it was due to a tick bite and not a blood clot. So, nothing to worry about but to take the antibiotics that were prescribed by Dr. Dahlia Byes. Patient understood and had no further questions.

## 2017-09-08 NOTE — Progress Notes (Signed)
Patient ID: TACHINA SPOONEMORE, female   DOB: 1955-11-06, 62 y.o.   MRN: [id number]  HPI Shannon Barton is a 62 y.o. female seen in consultation at the request of Dr. Army Melia.  Reports apparently she had a tick bite about a week ago and developed some blistering.  She was prescribed doxycycline.  Reports significant pain ( moderate in intensity, intermittent and sharp) on the right leg and edema.  No fevers no chills.  There is been some clear drainage. She is most daily about a half a pack.. No previous operation on the right leg or trauma. She is able to perform more than 4 METS of activity without any shortness of breath or chest pain      HPI  Past Medical History:  Diagnosis Date  . Elevated BP without diagnosis of hypertension 04/05/2014  . Smoking     Past Surgical History:  Procedure Laterality Date  . TONSILLECTOMY  1963    No family history on file.  Social History Social History   Tobacco Use  . Smoking status: Current Every Day Smoker    Packs/day: 1.00  . Smokeless tobacco: Never Used  Substance Use Topics  . Alcohol use: No  . Drug use: No    Allergies  Allergen Reactions  . Atorvastatin Other (See Comments)    Abdominal pain    Current Outpatient Medications  Medication Sig Dispense Refill  . amoxicillin-clavulanate (AUGMENTIN) 875-125 MG tablet Take 1 tablet by mouth 2 (two) times daily for 10 days. 20 tablet 0  . doxycycline (VIBRA-TABS) 100 MG tablet Take 100 mg by mouth every 12 (twelve) hours.  0  . simvastatin (ZOCOR) 10 MG tablet Take 1 tablet (10 mg total) by mouth at bedtime. 30 tablet 3   No current facility-administered medications for this visit.      Review of Systems Full ROS  was asked and was negative except for the information on the HPI  Physical Exam Blood pressure (!) 165/91, pulse 82, temperature 97.9 F (36.6 C), temperature source Oral, height 5\' 4"  (1.626 m), weight 80.4 kg (177 lb 3.2 oz).  Const: NAD, awake and  alert, walks normallyl. RESPIRATORY: There is normal respiratory effort, without pathologic use of accessory muscles. GI: The abdomen is  soft, nontender, and nondistended. There are no palpable masses. There is no hepatosplenomegaly. There are normal bowel sounds in all quadrants. GU: Rectal deferred.   MUSCULOSKELETAL: Normal muscle strength and tone.  Right lower extremity with edema.  SKIN: There is a 2 cm blister on the crease on the right posterior knee.  There is some edema on the right leg  No evidence of necrotizing infection no evidence of abscess nEUROLOGIC: Motor and sensation is grossly normal. Cranial nerves are grossly intact. PSYCH:  Oriented to person, place and time. Affect is normal.  Data Reviewed  I have personally reviewed the patient's imaging, laboratory findings and medical records.    Assessment/ Plan Cellulitis and blister on the right leg associated with edema.  I agree with antibiotic therapy.  I placed a Band-Aid and she should continue twice daily bandage changes.  There is no need for any surgical debridement or I&D.  I will also order an ultrasound to rule out any DVT given the significant edema.  We will see her back in a few weeks.  No need for surgical intervention at this time. A copy of this report will be sent to the referring provider  Caroleen Hamman, MD [facility] General Surgeon  09/08/2017, 1:15 PM

## 2017-09-13 ENCOUNTER — Telehealth: Payer: Self-pay

## 2017-09-13 NOTE — Telephone Encounter (Signed)
Left message for patient to return call to office regarding Ultrasound results. Negative for DVT per Dr.Pabon.

## 2017-09-19 ENCOUNTER — Ambulatory Visit: Payer: Self-pay | Admitting: Surgery

## 2017-09-20 ENCOUNTER — Telehealth: Payer: Self-pay | Admitting: Surgery

## 2017-09-20 NOTE — Telephone Encounter (Signed)
Unable to leave a message for the patient to call the office to r/s her no showed appointment on 09/19/17 with Dr. Dahlia Byes. I have also mailed a letter for the patient to contact our office.

## 2019-01-10 IMAGING — US US EXTREM LOW VENOUS*R*
1 series · 13 of 24 positions shown · non-contrast
Comparison: None.

CLINICAL DATA: 61-year-old female with a 1 week history of right
lower extremity swelling. Recent tick bite.



[Series 1: us extrem low venous*right* · 62 acquisitions, 13 frames shown]
[im 1/62]
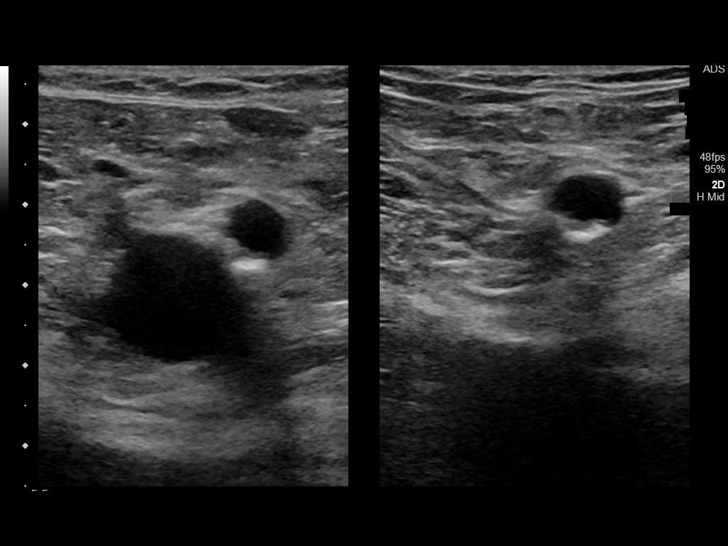
[im 6/62]
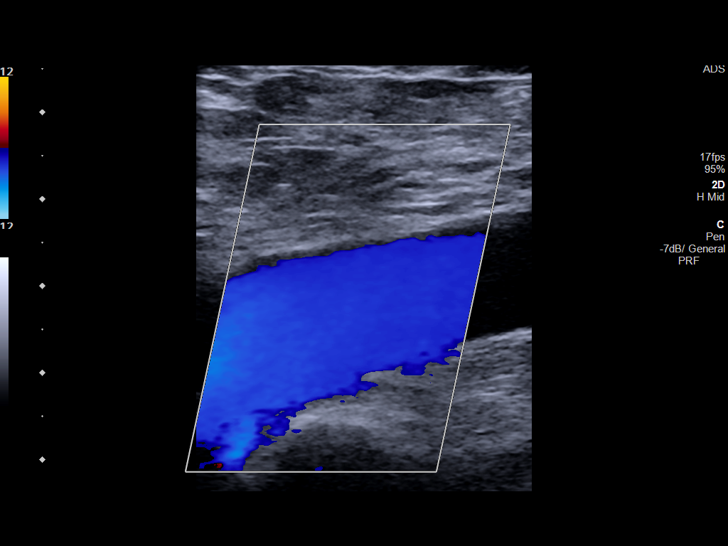
[im 11/62]
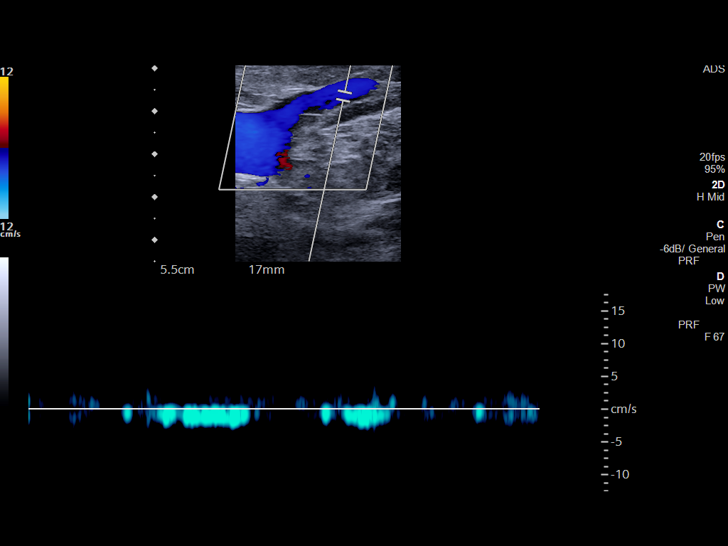
[im 16/62]
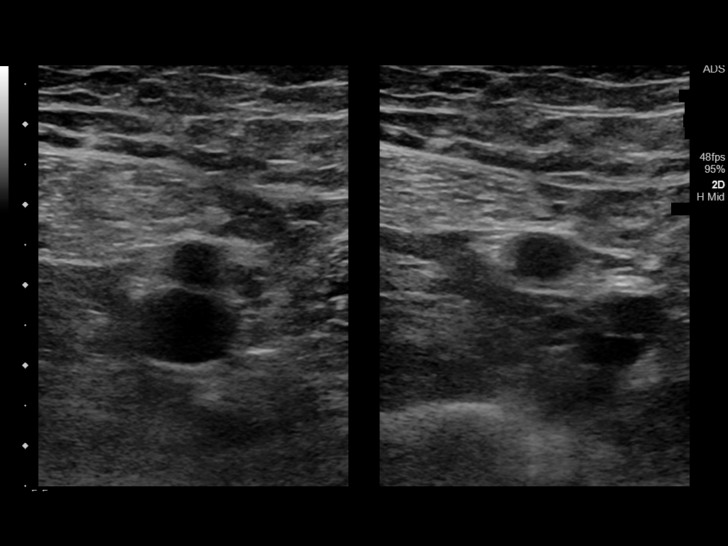
[im 22/62]
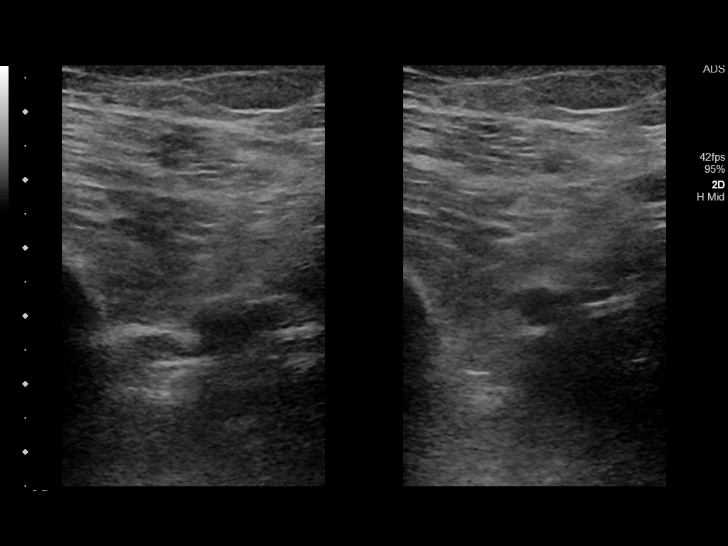
[im 27/62]
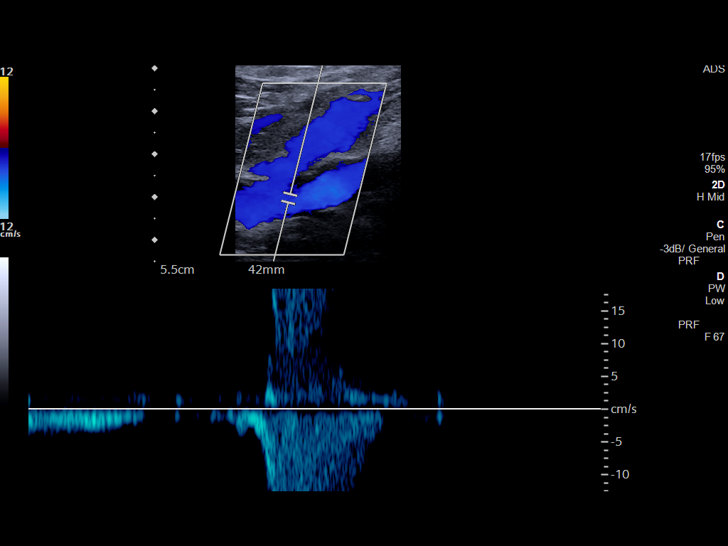
[im 35/62]
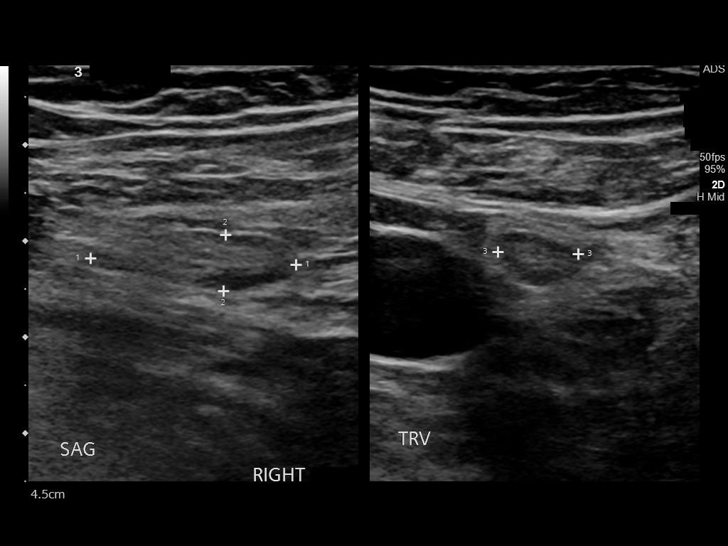
[im 38/62]
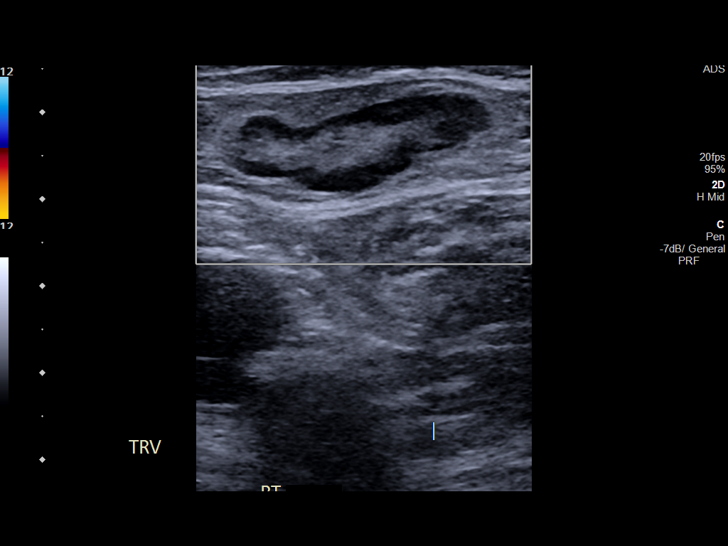
[im 43/62]
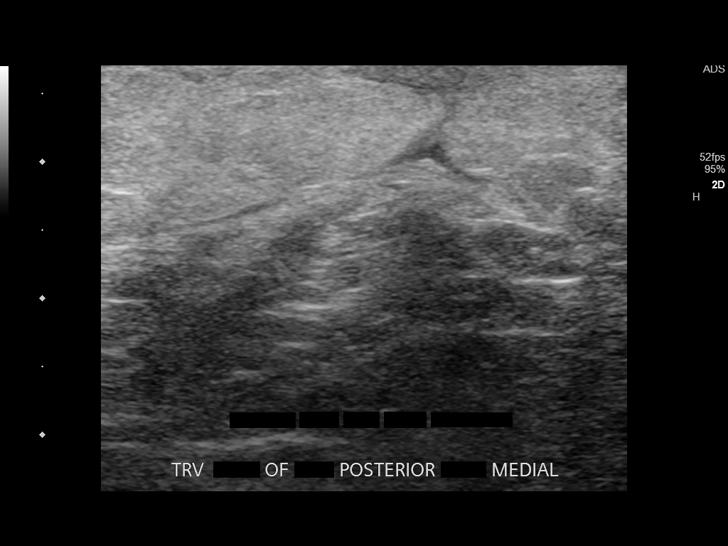
[im 48/62]
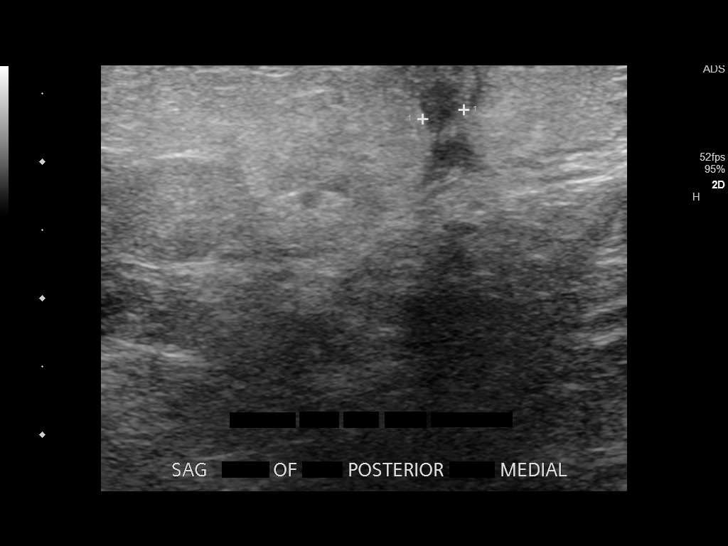
[im 54/62]
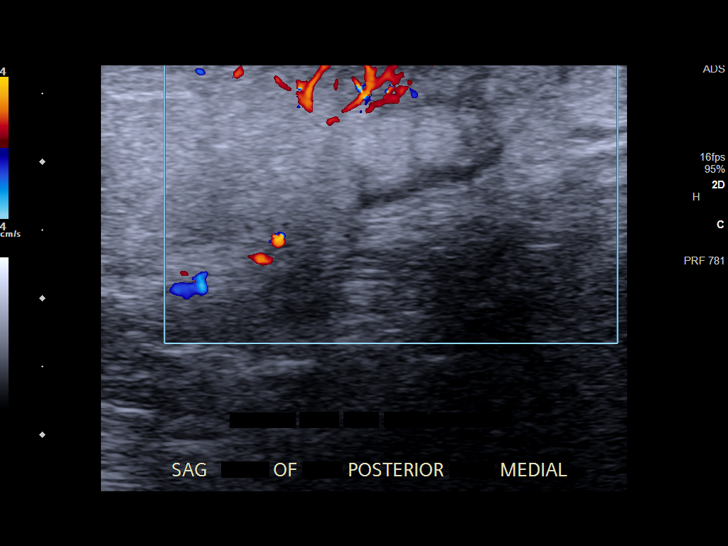
[im 59/62]
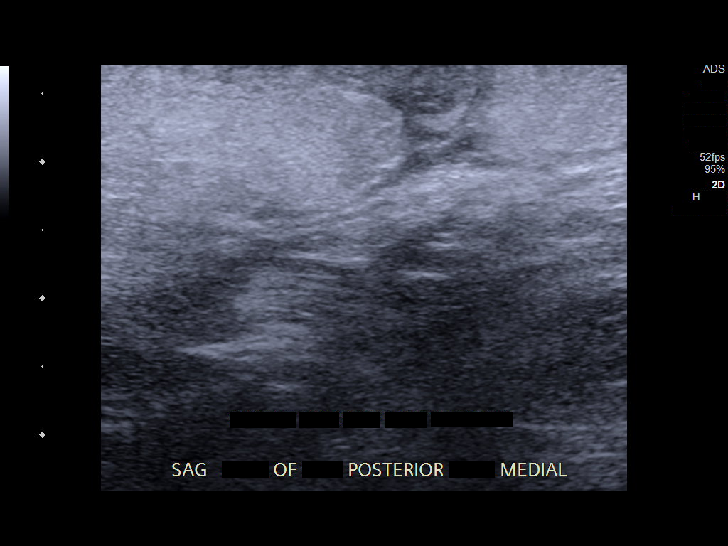
[im 62/62]
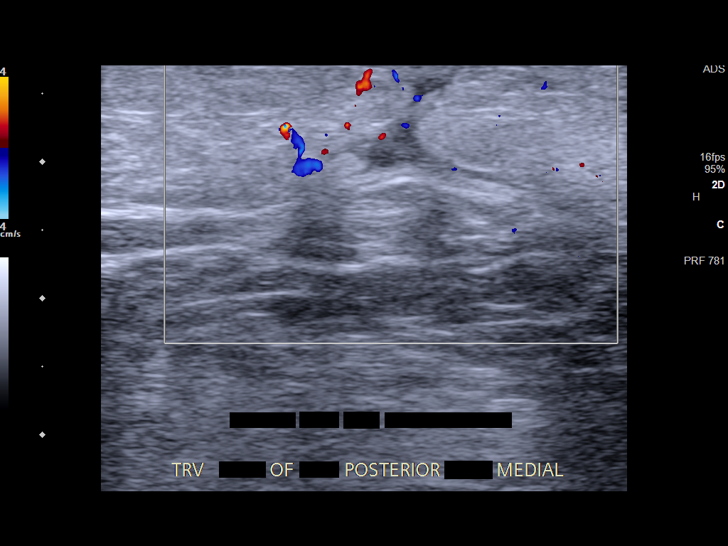

[13 of 24 positions shown; findings below may reference images not displayed]

FINDINGS: Contralateral Common Femoral Vein: Respiratory phasicity is normal
and symmetric with the symptomatic side. No evidence of thrombus.
Normal compressibility.

Common Femoral Vein: No evidence of thrombus. Normal
compressibility, respiratory phasicity and response to augmentation.

Saphenofemoral Junction: No evidence of thrombus. Normal
compressibility and flow on color Doppler imaging.

Profunda Femoral Vein: No evidence of thrombus. Normal
compressibility and flow on color Doppler imaging.

Femoral Vein: No evidence of thrombus. Normal compressibility,
respiratory phasicity and response to augmentation.

Popliteal Vein: No evidence of thrombus. Normal compressibility,
respiratory phasicity and response to augmentation.

Calf Veins: No evidence of thrombus. Normal compressibility and flow
on color Doppler imaging.

Superficial Great Saphenous Vein: No evidence of thrombus. Normal
compressibility.

Venous Reflux:  None.

Other Findings: Small amount of ill-defined hypoechogenicity in the
superficial subcutaneous fat in the region of the prior tip by in
the posteromedial aspect of the knee. This appears to represent soft
tissue edema. No discrete fluid collection.
IMPRESSION: 1. No evidence of deep venous thrombosis.
2. Focal edema/inflammation in the superficial subcutaneous fat in
the posteromedial aspect of the right knee in the region of the
right recent tick bite. This is favored to represent edema, or
cellulitis. No defined fluid collection to suggest abscess.

## 2019-06-16 ENCOUNTER — Ambulatory Visit: Payer: Managed Care, Other (non HMO) | Attending: Internal Medicine

## 2019-06-16 ENCOUNTER — Other Ambulatory Visit: Payer: Self-pay

## 2019-06-16 DIAGNOSIS — Z23 Encounter for immunization: Secondary | ICD-10-CM

## 2019-06-16 NOTE — Progress Notes (Signed)
   Covid-19 Vaccination Clinic  Name:  Shannon Barton    MRN: [id number] DOB: Dec 28, 1955  06/16/2019  [name] was observed post Covid-19 immunization for 15 minutes without incident. She was provided with Vaccine Information Sheet and instruction to access the V-Safe system.   [name] was instructed to call 911 with any severe reactions post vaccine: Marland Kitchen Difficulty breathing  . Swelling of face and throat  . A fast heartbeat  . A bad rash all over body  . Dizziness and weakness   Immunizations Administered    Name Date Dose VIS Date Route   Pfizer COVID-19 Vaccine 06/16/2019  1:37 PM 0.3 mL 03/16/2019 Intramuscular   Manufacturer: [place]   Lot: [id number]   [place]: [id number]

## 2019-07-11 ENCOUNTER — Ambulatory Visit: Payer: Managed Care, Other (non HMO) | Attending: Internal Medicine

## 2019-07-11 DIAGNOSIS — Z23 Encounter for immunization: Secondary | ICD-10-CM

## 2019-07-11 NOTE — Progress Notes (Signed)
   Covid-19 Vaccination Clinic  Name:  QUAMESHA PALLER    MRN: [id number] DOB: [birth year]  07/11/2019  Ms. Grahovac was observed post Covid-19 immunization for 15 minutes without incident. She was provided with Vaccine Information Sheet and instruction to access the V-Safe system.   Ms. Urbach was instructed to call 911 with any severe reactions post vaccine: Marland Kitchen Difficulty breathing  . Swelling of face and throat  . A fast heartbeat  . A bad rash all over body  . Dizziness and weakness   Immunizations Administered    Name Date Dose VIS Date Route   Pfizer COVID-19 Vaccine 07/11/2019  3:47 PM 0.3 mL 03/16/2019 Intramuscular   Manufacturer: [place]   Lot: [id number]   [place]: [id number]

## 2022-04-09 ENCOUNTER — Ambulatory Visit (INDEPENDENT_AMBULATORY_CARE_PROVIDER_SITE_OTHER): Payer: Medicare HMO | Admitting: Internal Medicine

## 2022-04-09 ENCOUNTER — Encounter: Payer: Self-pay | Admitting: Internal Medicine

## 2022-04-09 VITALS — BP 160/90 | HR 82 | Ht 64.0 in | Wt 170.0 lb

## 2022-04-09 DIAGNOSIS — L723 Sebaceous cyst: Secondary | ICD-10-CM

## 2022-04-09 DIAGNOSIS — Z1231 Encounter for screening mammogram for malignant neoplasm of breast: Secondary | ICD-10-CM

## 2022-04-09 DIAGNOSIS — Z1211 Encounter for screening for malignant neoplasm of colon: Secondary | ICD-10-CM | POA: Diagnosis not present

## 2022-04-09 DIAGNOSIS — I10 Essential (primary) hypertension: Secondary | ICD-10-CM | POA: Diagnosis not present

## 2022-04-09 DIAGNOSIS — Z23 Encounter for immunization: Secondary | ICD-10-CM

## 2022-04-09 MED ORDER — IRBESARTAN 150 MG PO TABS: 150.0000 mg | ORAL_TABLET | Freq: Every day | ORAL | 1 refills | Status: DC

## 2022-04-09 NOTE — Progress Notes (Signed)
Date:  04/09/2022   Name:  Shannon Barton   DOB:  1956-01-06   MRN:  [id number]   Chief Complaint: New Patient (Initial Visit) and Mass (Right axilla lump. X 1 month ago. Not painful, no discharge. This is a new problem. )  Hypertension This is a chronic problem. The problem is unchanged. The problem is uncontrolled. Pertinent negatives include no blurred vision, chest pain, headaches, palpitations, peripheral edema or shortness of breath. There are no associated agents to hypertension. Past treatments include nothing.   Axillary lump - noticed a month ago, non tender and not changing.  No drainage noted.  Lab Results  Component Value Date   NA 140 06/17/2016   K 4.8 06/17/2016   CO2 23 06/17/2016   GLUCOSE 106 (H) 06/17/2016   BUN 13 06/17/2016   CREATININE 0.71 06/17/2016   CALCIUM 9.8 06/17/2016   GFRNONAA 94 06/17/2016   Lab Results  Component Value Date   CHOL 238 (H) 06/17/2016   HDL 36 (L) 06/17/2016   LDLCALC 186 (H) 06/17/2016   TRIG 80 06/17/2016   CHOLHDL 6.6 (H) 06/17/2016   Lab Results  Component Value Date   TSH 0.835 06/17/2016   No results found for: "HGBA1C" Lab Results  Component Value Date   WBC 10.8 06/17/2016   HGB 14.4 06/17/2016   HCT 43.0 06/17/2016   MCV 88 06/17/2016   PLT 318 06/17/2016   Lab Results  Component Value Date   ALT 11 06/17/2016   AST 18 06/17/2016   ALKPHOS 79 06/17/2016   BILITOT <0.2 06/17/2016   No results found for: "25OHVITD2", "25OHVITD3", "VD25OH"   Review of Systems  Constitutional:  Negative for chills, fatigue and fever.  Eyes:  Negative for blurred vision.  Respiratory:  Negative for chest tightness and shortness of breath.   Cardiovascular:  Negative for chest pain and palpitations.  Gastrointestinal:  Negative for blood in stool and constipation.  Genitourinary:  Positive for frequency and urgency. Negative for dysuria and hematuria.  Neurological:  Negative for headaches.   Psychiatric/Behavioral:  Negative for dysphoric mood and sleep disturbance. The patient is not nervous/anxious.     Patient Active Problem List   Diagnosis Date Noted   Mixed hyperlipidemia 11/10/2016   Tobacco use disorder 06/13/2016   BMI 30.0-30.9,adult 06/13/2016    Allergies  Allergen Reactions   Atorvastatin Other (See Comments)    Abdominal pain    Past Surgical History:  Procedure Laterality Date   TONSILLECTOMY  1963    Social History   Tobacco Use   Smoking status: Every Day    Packs/day: 0.50    Years: 30.00    Total pack years: 15.00    Types: Cigarettes   Smokeless tobacco: Never  Vaping Use   Vaping Use: Never used  Substance Use Topics   Alcohol use: No   Drug use: No     Medication list has been reviewed and updated.  No outpatient medications have been marked as taking for the 04/09/22 encounter (Office Visit) with Glean Hess, MD.       04/09/2022    3:35 PM  GAD 7 : Generalized Anxiety Score  Nervous, Anxious, on Edge 0  Control/stop worrying 0  Worry too much - different things 0  Trouble relaxing 0  Restless 0  Easily annoyed or irritable 0  Afraid - awful might happen 0  Total GAD 7 Score 0  Anxiety Difficulty Not difficult at all  04/09/2022    3:35 PM 09/07/2017    1:44 PM 01/26/2016    2:22 PM  Depression screen PHQ 2/9  Decreased Interest 0 0 0  Down, Depressed, Hopeless 0 0 0  PHQ - 2 Score 0 0 0  Altered sleeping 0    Tired, decreased energy 0    Change in appetite 0    Feeling bad or failure about yourself  0    Trouble concentrating 0    Moving slowly or fidgety/restless 0    Suicidal thoughts 0    PHQ-9 Score 0    Difficult doing work/chores Not difficult at all      BP Readings from Last 3 Encounters:  04/09/22 (!) 160/90  09/07/17 (!) 165/91  09/07/17 118/82    Physical Exam Vitals and nursing note reviewed.  Constitutional:      General: She is not in acute distress.    Appearance: Normal  appearance. She is well-developed.  HENT:     Head: Normocephalic and atraumatic.  Neck:     Vascular: No carotid bruit.  Cardiovascular:     Rate and Rhythm: Normal rate and regular rhythm.     Heart sounds: No murmur heard. Pulmonary:     Effort: Pulmonary effort is normal. No respiratory distress.     Breath sounds: No wheezing or rhonchi.  Chest:  Breasts:    Right: No mass, nipple discharge, skin change or tenderness.     Left: No mass, nipple discharge, skin change or tenderness.       Comments: 1 cm sebaceous cyst - not inflamed or tender. Musculoskeletal:     Cervical back: Normal range of motion.     Right lower leg: No edema.     Left lower leg: No edema.  Lymphadenopathy:     Cervical: No cervical adenopathy.  Skin:    General: Skin is warm and dry.     Capillary Refill: Capillary refill takes less than 2 seconds.     Findings: No rash.  Neurological:     Mental Status: She is alert and oriented to person, place, and time.  Psychiatric:        Mood and Affect: Mood normal.        Behavior: Behavior normal.     Wt Readings from Last 3 Encounters:  04/09/22 170 lb (77.1 kg)  09/07/17 177 lb 3.2 oz (80.4 kg)  09/07/17 177 lb (80.3 kg)    BP (!) 160/90   Pulse 82   Ht '5\' 4"'$  (1.626 m)   Wt 170 lb (77.1 kg)   SpO2 98%   BMI 29.18 kg/m   Assessment and Plan: 1. Essential hypertension Uncontrolled - recommend starting medication Follow up in  weeks with labs - irbesartan (AVAPRO) 150 MG tablet; Take 1 tablet (150 mg total) by mouth daily.  Dispense: 30 tablet; Refill: 1  2. Sebaceous cyst of axilla Pt reassured - no treatment is needed unless it becomes infected  3. Encounter for screening mammogram for breast cancer Schedule at [facility] - MM 3D SCREEN BREAST BILATERAL  4. Colon cancer screening - Ambulatory referral to Gastroenterology  5. Need for immunization against influenza - Flu Vaccine QUAD High Dose(Fluad)  6. Need for pneumococcal 20-valent  conjugate vaccination - Pneumococcal conjugate vaccine 20-valent (Prevnar 20)   Partially dictated using Editor, commissioning. Any errors are unintentional.  Halina Maidens, MD [facility]  04/09/2022

## 2022-04-09 NOTE — Assessment & Plan Note (Signed)
Long standing borderline elevated BPs Will start Avapro and recheck with labs in 6 weeks

## 2022-04-09 NOTE — Patient Instructions (Signed)
Call [facility] to schedule your mammogram at [phone].  

## 2022-04-16 ENCOUNTER — Ambulatory Visit: Payer: Managed Care, Other (non HMO) | Admitting: Internal Medicine

## 2022-04-19 ENCOUNTER — Other Ambulatory Visit: Payer: Self-pay

## 2022-04-19 ENCOUNTER — Telehealth: Payer: Self-pay

## 2022-04-19 ENCOUNTER — Encounter: Payer: Self-pay | Admitting: Gastroenterology

## 2022-04-19 DIAGNOSIS — Z1211 Encounter for screening for malignant neoplasm of colon: Secondary | ICD-10-CM

## 2022-04-19 NOTE — Telephone Encounter (Signed)
Gastroenterology Pre-Procedure Review  Request Date: 05/03/22 Requesting Physician: Dr. Allen Norris  PATIENT REVIEW QUESTIONS: The patient responded to the following health history questions as indicated:    1. Are you having any GI issues? no 2. Do you have a personal history of Polyps? no 3. Do you have a family history of Colon Cancer or Polyps? no 4. Diabetes Mellitus? no 5. Joint replacements in the past 12 months?no 6. Major health problems in the past 3 months?no 7. Any artificial heart valves, MVP, or defibrillator?no    MEDICATIONS & ALLERGIES:    Patient reports the following regarding taking any anticoagulation/antiplatelet therapy:   Plavix, Coumadin, Eliquis, Xarelto, Lovenox, Pradaxa, Brilinta, or Effient? no Aspirin? no  Patient confirms/reports the following medications:  Current Outpatient Medications  Medication Sig Dispense Refill   irbesartan (AVAPRO) 150 MG tablet Take 1 tablet (150 mg total) by mouth daily. 30 tablet 1   No current facility-administered medications for this visit.    Patient confirms/reports the following allergies:  Allergies  Allergen Reactions   Atorvastatin Other (See Comments)    Abdominal pain    No orders of the defined types were placed in this encounter.   AUTHORIZATION INFORMATION Primary Insurance: 1D#: Group #:  Secondary Insurance: 1D#: Group #:  SCHEDULE INFORMATION: Date: 05/03/22 Time: Location: [place]

## 2022-04-26 ENCOUNTER — Telehealth: Payer: Self-pay

## 2022-04-26 MED ORDER — NA SULFATE-K SULFATE-MG SULF 17.5-3.13-1.6 GM/177ML PO SOLN: 1.0000 | Freq: Once | ORAL | 0 refills | Status: AC

## 2022-04-26 NOTE — Telephone Encounter (Signed)
Patient requested her prescription to be sent to pharmacy.  Returned phone call to let her know I will be sending it in for her now.  [facility] [place].  Thanks, [place], [place]

## 2022-05-03 ENCOUNTER — Ambulatory Visit: Payer: Medicare HMO | Admitting: Anesthesiology

## 2022-05-03 ENCOUNTER — Encounter: Payer: Self-pay | Admitting: Gastroenterology

## 2022-05-03 ENCOUNTER — Ambulatory Visit
Admission: RE | Admit: 2022-05-03 | Discharge: 2022-05-03 | Disposition: A | Discharge status: Home / Self Care | Payer: Medicare HMO | Attending: Gastroenterology | Admitting: Gastroenterology

## 2022-05-03 ENCOUNTER — Encounter
Admission: RE | Disposition: A | Discharge status: Home / Self Care | Payer: Self-pay | Source: Home / Self Care | Attending: Gastroenterology

## 2022-05-03 ENCOUNTER — Other Ambulatory Visit: Payer: Self-pay

## 2022-05-03 DIAGNOSIS — D125 Benign neoplasm of sigmoid colon: Secondary | ICD-10-CM | POA: Diagnosis not present

## 2022-05-03 DIAGNOSIS — K635 Polyp of colon: Secondary | ICD-10-CM

## 2022-05-03 DIAGNOSIS — E785 Hyperlipidemia, unspecified: Secondary | ICD-10-CM | POA: Diagnosis not present

## 2022-05-03 DIAGNOSIS — F17210 Nicotine dependence, cigarettes, uncomplicated: Secondary | ICD-10-CM | POA: Diagnosis not present

## 2022-05-03 DIAGNOSIS — Z1211 Encounter for screening for malignant neoplasm of colon: Secondary | ICD-10-CM | POA: Diagnosis not present

## 2022-05-03 DIAGNOSIS — I10 Essential (primary) hypertension: Secondary | ICD-10-CM | POA: Insufficient documentation

## 2022-05-03 DIAGNOSIS — K5730 Diverticulosis of large intestine without perforation or abscess without bleeding: Secondary | ICD-10-CM | POA: Diagnosis not present

## 2022-05-03 DIAGNOSIS — K640 First degree hemorrhoids: Secondary | ICD-10-CM | POA: Insufficient documentation

## 2022-05-03 DIAGNOSIS — D126 Benign neoplasm of colon, unspecified: Secondary | ICD-10-CM | POA: Diagnosis not present

## 2022-05-03 HISTORY — PX: COLONOSCOPY WITH PROPOFOL: SHX5780

## 2022-05-03 HISTORY — DX: Essential (primary) hypertension: I10

## 2022-05-03 SURGERY — COLONOSCOPY WITH PROPOFOL
Anesthesia: General

## 2022-05-03 MED ORDER — LACTATED RINGERS IV SOLN: INTRAVENOUS | Status: DC

## 2022-05-03 MED ORDER — PROPOFOL 10 MG/ML IV BOLUS
INTRAVENOUS | Status: DC | PRN
  Administered 2022-05-03: 40 mg via INTRAVENOUS
  Administered 2022-05-03 (×2): 30 mg via INTRAVENOUS
  Administered 2022-05-03: 40 mg via INTRAVENOUS
  Administered 2022-05-03: 90 mg via INTRAVENOUS

## 2022-05-03 MED ORDER — LIDOCAINE HCL (CARDIAC) PF 100 MG/5ML IV SOSY
PREFILLED_SYRINGE | INTRAVENOUS | Status: DC | PRN
  Administered 2022-05-03: 40 mg via INTRAVENOUS

## 2022-05-03 MED ORDER — STERILE WATER FOR IRRIGATION IR SOLN
Status: DC | PRN
  Administered 2022-05-03: 1000 mL

## 2022-05-03 SURGICAL SUPPLY — 21 items

## 2022-05-03 NOTE — H&P (Signed)
Lucilla Lame, MD [facility] [address], [place], [place] [address] Phone: [phone] Fax : [phone]  Primary Care Physician:  Glean Hess, MD Primary Gastroenterologist:  Dr. Allen Norris  Pre-Procedure History & Physical: HPI:  Shannon Barton is a 67 y.o. female is here for a screening colonoscopy.   Past Medical History:  Diagnosis Date   Elevated BP without diagnosis of hypertension 04/05/2014   Hyperlipidemia    Hypertension    Smoking     Past Surgical History:  Procedure Laterality Date   TONSILLECTOMY  1963    Prior to Admission medications   Medication Sig Start Date End Date Taking? Authorizing Provider  irbesartan (AVAPRO) 150 MG tablet Take 1 tablet (150 mg total) by mouth daily. 04/09/22  Yes Glean Hess, MD    Allergies as of 04/19/2022 - Review Complete 04/19/2022  Allergen Reaction Noted   Atorvastatin Other (See Comments) 11/11/2016    Family History  Problem Relation Age of Onset   Heart attack Father    Hypertension Sister     Social History   Socioeconomic History   Marital status: Single    Spouse name: Not on file   Number of children: 0   Years of education: Not on file   Highest education level: Not on file  Occupational History   Not on file  Tobacco Use   Smoking status: Every Day    Packs/day: 0.50    Years: 30.00    Total pack years: 15.00    Types: Cigarettes   Smokeless tobacco: Never  Vaping Use   Vaping Use: Never used  Substance and Sexual Activity   Alcohol use: No   Drug use: No   Sexual activity: Not Currently  Other Topics Concern   Not on file  Social History Narrative   Not on file   Social Determinants of Health   Financial Resource Strain: Low Risk  (04/09/2022)   Overall Financial Resource Strain (CARDIA)    Difficulty of Paying Living Expenses: Not hard at all  Food Insecurity: No Food Insecurity (04/09/2022)   Hunger Vital Sign    Worried About Running Out of Food in the Last Year:  Never true    Ran Out of Food in the Last Year: Never true  Transportation Needs: No Transportation Needs (04/09/2022)   PRAPARE - Hydrologist (Medical): No    Lack of Transportation (Non-Medical): No  Physical Activity: Not on file  Stress: Not on file  Social Connections: Not on file  Intimate Partner Violence: Not At Risk (04/09/2022)   Humiliation, Afraid, Rape, and Kick questionnaire    Fear of Current or Ex-Partner: No    Emotionally Abused: No    Physically Abused: No    Sexually Abused: No    Review of Systems: See HPI, otherwise negative ROS  Physical Exam: BP (!) 177/63   Pulse 69   Temp 97.7 F (36.5 C) (Temporal)   Resp 18   Ht '5\' 4"'$  (1.626 m)   Wt 73.9 kg   SpO2 97%   BMI 27.98 kg/m  General:   Alert,  pleasant and cooperative in NAD Head:  Normocephalic and atraumatic. Neck:  Supple; no masses or thyromegaly. Lungs:  Clear throughout to auscultation.    Heart:  Regular rate and rhythm. Abdomen:  Soft, nontender and nondistended. Normal bowel sounds, without guarding, and without rebound.   Neurologic:  Alert and  oriented x4;  grossly normal neurologically.  Impression/Plan: Shannon Barton is now here to undergo a screening colonoscopy.  Risks, benefits, and alternatives regarding colonoscopy have been reviewed with the patient.  Questions have been answered.  All parties agreeable.

## 2022-05-03 NOTE — Transfer of Care (Signed)
Immediate Anesthesia Transfer of Care Note  Patient: Shannon Barton  Procedure(s) Performed: COLONOSCOPY WITH PROPOFOL with polypectomy  Patient Location: PACU  Anesthesia Type: General  Level of Consciousness: awake, alert  and patient cooperative  Airway and Oxygen Therapy: Patient Spontanous Breathing and Patient connected to supplemental oxygen  Post-op Assessment: Post-op Vital signs reviewed, Patient's Cardiovascular Status Stable, Respiratory Function Stable, Patent Airway and No signs of Nausea or vomiting  Post-op Vital Signs: Reviewed and stable  Complications: No notable events documented.

## 2022-05-03 NOTE — Anesthesia Postprocedure Evaluation (Signed)
Anesthesia Post Note  Patient: Shannon Barton  Procedure(s) Performed: COLONOSCOPY WITH PROPOFOL with polypectomy  Patient location during evaluation: Endoscopy Anesthesia Type: General Level of consciousness: awake and alert Pain management: pain level controlled Vital Signs Assessment: post-procedure vital signs reviewed and stable Respiratory status: spontaneous breathing, nonlabored ventilation, respiratory function stable and patient connected to nasal cannula oxygen Cardiovascular status: blood pressure returned to baseline and stable Postop Assessment: no apparent nausea or vomiting Anesthetic complications: no   No notable events documented.   Last Vitals:  Vitals:   05/03/22 1138 05/03/22 1145  BP: (!) 147/76 123/74  Pulse: 84 70  Resp: 14 (!) 24  Temp: (!) 36.3 C (!) 36.3 C  SpO2: 93% 94%    Last Pain:  Vitals:   05/03/22 1145  TempSrc:   PainSc: 0-No pain                 Martha Clan

## 2022-05-03 NOTE — Anesthesia Preprocedure Evaluation (Signed)
Anesthesia Evaluation  Patient identified by MRN, date of birth, ID band Patient awake    Reviewed: Allergy & Precautions, H&P , NPO status , Patient's Chart, lab work & pertinent test results, reviewed documented beta blocker date and time   History of Anesthesia Complications Negative for: history of anesthetic complications  Airway Mallampati: IV  TM Distance: >3 FB Neck ROM: full    Dental  (+) Dental Advidsory Given, Caps, Missing, Teeth Intact   Pulmonary neg shortness of breath, neg sleep apnea, neg COPD, Recent URI , Current Smoker   Pulmonary exam normal breath sounds clear to auscultation       Cardiovascular Exercise Tolerance: Good hypertension, (-) angina (-) Past MI and (-) Cardiac Stents Normal cardiovascular exam(-) dysrhythmias (-) Valvular Problems/Murmurs Rhythm:regular Rate:Normal     Neuro/Psych negative neurological ROS  negative psych ROS   GI/Hepatic negative GI ROS, Neg liver ROS,,,  Endo/Other  negative endocrine ROS    Renal/GU negative Renal ROS  negative genitourinary   Musculoskeletal   Abdominal   Peds  Hematology negative hematology ROS (+)   Anesthesia Other Findings Past Medical History: 04/05/2014: Elevated BP without diagnosis of hypertension No date: Hyperlipidemia No date: Hypertension No date: Smoking   Reproductive/Obstetrics negative OB ROS                             Anesthesia Physical Anesthesia Plan  ASA: 2  Anesthesia Plan: General   Post-op Pain Management:    Induction: Intravenous  PONV Risk Score and Plan: 2 and Propofol infusion and TIVA  Airway Management Planned: Natural Airway and Nasal Cannula  Additional Equipment:   Intra-op Plan:   Post-operative Plan:   Informed Consent: I have reviewed the patients History and Physical, chart, labs and discussed the procedure including the risks, benefits and alternatives for  the proposed anesthesia with the patient or authorized representative who has indicated his/her understanding and acceptance.     Dental Advisory Given  Plan Discussed with: Anesthesiologist, CRNA and Surgeon  Anesthesia Plan Comments:        Anesthesia Quick Evaluation

## 2022-05-03 NOTE — Op Note (Signed)
[facility] Patient Name: Shannon Barton Procedure Date: 05/03/2022 11:11 AM MRN: [id number] Account #: [id number] Date of Birth: December 13, 1955 Admit Type: Outpatient Age: 67 Room: [facility] OR ROOM 01 Gender: Female Note Status: Finalized Instrument Name: [id number] Procedure:             Colonoscopy Indications:           Screening for colorectal malignant neoplasm Providers:             Lucilla Lame MD, MD Referring MD:          Halina Maidens, MD (Referring MD) Medicines:             Propofol per Anesthesia Complications:         No immediate complications. Procedure:             Pre-Anesthesia Assessment:                        - Prior to the procedure, a History and Physical was                         performed, and patient medications and allergies were                         reviewed. The patient's tolerance of previous                         anesthesia was also reviewed. The risks and benefits                         of the procedure and the sedation options and risks                         were discussed with the patient. All questions were                         answered, and informed consent was obtained. Prior                         Anticoagulants: The patient has taken no anticoagulant                         or antiplatelet agents. ASA Grade Assessment: II - A                         patient with mild systemic disease. After reviewing                         the risks and benefits, the patient was deemed in                         satisfactory condition to undergo the procedure.                        After obtaining informed consent, the colonoscope was                         passed under direct vision. Throughout the procedure,  the patient's blood pressure, pulse, and oxygen                         saturations were monitored continuously. The                         Colonoscope was introduced through the anus  and                         advanced to the the cecum, identified by appendiceal                         orifice and ileocecal valve. The colonoscopy was                         performed without difficulty. The patient tolerated                         the procedure well. The quality of the bowel                         preparation was good. Findings:      The perianal and digital rectal examinations were normal.      A 4 mm polyp was found in the sigmoid colon. The polyp was sessile. The       polyp was removed with a cold snare. Resection and retrieval were       complete.      Non-bleeding internal hemorrhoids were found during retroflexion. The       hemorrhoids were Grade I (internal hemorrhoids that do not prolapse).      Multiple small-mouthed diverticula were found in the entire colon. Impression:            - One 4 mm polyp in the sigmoid colon, removed with a                         cold snare. Resected and retrieved.                        - Non-bleeding internal hemorrhoids.                        - Diverticulosis in the entire examined colon. Recommendation:        - Discharge patient to home.                        - Resume previous diet.                        - Continue present medications.                        - Await pathology results.                        - If the pathology report reveals adenomatous tissue,                         then repeat the colonoscopy for surveillance in 7  years. Procedure Code(s):     --- Professional ---                        [phone], Colonoscopy, flexible; with removal of                         tumor(s), polyp(s), or other lesion(s) by snare                         technique Diagnosis Code(s):     --- Professional ---                        Z12.11, Encounter for screening for malignant neoplasm                         of colon                        D12.5, Benign neoplasm of sigmoid colon CPT copyright 2022  American Medical Association. All rights reserved. The codes documented in this report are preliminary and upon coder review may  be revised to meet current compliance requirements. Lucilla Lame MD, MD 05/03/2022 11:38:03 AM This report has been signed electronically. Number of Addenda: 0 Note Initiated On: 05/03/2022 11:11 AM Scope Withdrawal Time: 0 hours 6 minutes 44 seconds  Total Procedure Duration: 0 hours 11 minutes 7 seconds  Estimated Blood Loss:  Estimated blood loss: none.      [facility]

## 2022-05-04 ENCOUNTER — Encounter: Payer: Self-pay | Admitting: Gastroenterology

## 2022-05-05 LAB — SURGICAL PATHOLOGY

## 2022-05-08 ENCOUNTER — Encounter: Payer: Self-pay | Admitting: Gastroenterology

## 2022-05-21 ENCOUNTER — Encounter: Payer: Self-pay | Admitting: Internal Medicine

## 2022-05-21 ENCOUNTER — Ambulatory Visit (INDEPENDENT_AMBULATORY_CARE_PROVIDER_SITE_OTHER): Payer: Medicare HMO | Admitting: Internal Medicine

## 2022-05-21 VITALS — BP 142/78 | HR 87 | Ht 64.0 in | Wt 168.0 lb

## 2022-05-21 DIAGNOSIS — I10 Essential (primary) hypertension: Secondary | ICD-10-CM | POA: Diagnosis not present

## 2022-05-21 MED ORDER — IRBESARTAN 300 MG PO TABS: 300.0000 mg | ORAL_TABLET | Freq: Every day | ORAL | 1 refills | Status: DC

## 2022-05-21 NOTE — Assessment & Plan Note (Signed)
BP improved on Avapro 150 mg. No side effects noted.  Recommend screening labs Increase dose to 300 mg daily Follow up in 2 months.  Call if medication issues occur.

## 2022-05-21 NOTE — Patient Instructions (Signed)
The DASH Diet  Dietary Approaches to Stop Hypertension   What is hypertension?  Hypertension is the term for blood pressure that is consistently higher than normal. Blood pressure is the force of blood against artery walls. Blood pressure can be unhealthy if it is above 120/80. The higher your blood pressure, the greater the health risk. High blood pressure can be controlled if you take these steps:  . Maintain a healthy weight.  . Be physically active.  . Follow a healthy eating plan, which includes foods lower in salt and sodium.  . If you drink alcoholic beverages, do so in moderation.  As noted in this list, diet affects high blood pressure. Following the DASH diet and reducing the amount of sodium in your diet will help lower your blood pressure. It will also help prevent high blood pressure.   What is the DASH diet?  Dietary Approaches to Stop Hypertension (DASH) is a diet that is low in saturated fat, cholesterol, and total fat. It emphasizes fruits, vegetables, and low-fat dairy foods. The DASH diet also includes whole-grain products, fish, poultry, and nuts. It encourages fewer servings of red meat, sweets, and sugar-containing beverages. It is rich in magnesium, potassium, and calcium, as well as protein and fiber.   How do I get started on the DASH diet?  The DASH diet requires no special foods and has no hard-to-follow recipes. Start by seeing how DASH compares with your current eating habits. The DASH eating plan shown is based on 2,000 calories a day.   Your health care provider or a dietitian can help you determine how many calories a day you need. Most adults need somewhere between 1600 and 2800 calories a day. Serving sizes will vary between 1/2 cup and 1 1/4 cups. Check the product's nutrition label to determine serving sizes of particular products.  Type of Food Servings for Diet of 2000 Calories/Day  Grains/Grain products (include at least 3 whole grain foods each day) 7-8   Fruits 4-5  Vegetables 4-5  Low fat or non-fat dairy foods 2-3  Lean meats, fish, poultry 2 or less  Nuts, seeds, and legumes 4-5/week  Fats and sweets Limited   Make changes gradually.  Here are some suggestions that might help:  . If you now eat 1 or 2 servings of vegetables a day, add a serving at lunch and another at dinner.  . If you don't eat fruit now or have only juice at breakfast, add a serving to your meals or have it as a snack.  . Drink milk or water with lunch or dinner instead of soda, sugar sweetened tea, or alcohol. Choose low-fat (1%) or fat-free (skim) dairy products to reduce how much saturated fat, total fat, cholesterol, and calories you eat.  . If you have trouble digesting dairy products, try taking lactase enzyme pills or drops (available at drugstores and groceries) with the dairy foods. Or buy lactose-free milk or milk with lactase enzyme added to it.  . Read food labels on margarines and salad dressings to choose products lowest in fat.  . If you now eat large portions of meat, cut back gradually--by a half or a third at each meal. Limit meat to 6 ounces a day (2 servings). Three to four ounces is about the size of a deck of cards.  . Have 2 or more vegetarian-style (meatless) meals each week.   Increase servings of vegetables, rice, pasta, and beans in all meals.  Try casseroles and pasta, and   stir-fry dishes, which have less meat and more vegetables, grains, and beans.  . Use fruits canned in their own juice. Fresh fruits require little or no preparation. Dried fruits are a good choice to carry with you or to have ready in the car.  . Try these snacks ideas: unsalted pretzels or nuts mixed with raisins, graham crackers, low-fat and fat-free yogurt and frozen yogurt, popcorn with no salt or butter added, and raw vegetables.  . Choose whole grain foods to get more nutrients, including minerals and fiber. For example, choose whole-wheat bread or whole-grain cereals.   . Use fresh, frozen, or no-salt-added canned vegetables.   Remember to also reduce the salt and sodium in your diet. Try to have no more than 2000 milligrams (mg) of sodium per day, with a goal of further reducing the sodium to 1500 mg per day. Three important ways to reduce sodium are:  . Use reduced-sodium or no-salt-added food products.  . Use less salt when you prepare foods and do not add salt to your food at the table.  . Read fool labels. Aim for foods that are less than 5 percent of the daily value of sodium.   The DASH eating plan was not designed for weight loss. But it contains many lower calorie foods, such as fruits and vegetables. You can make it lower in calories by replacing higher calorie foods with more fruits and vegetables. Some ideas to increase fruits and vegetables and decrease calories include:  . Eat a medium apple instead of four shortbread cookies. You'll save 80 calories.  . Eat 1/4 cup of dried apricots instead of a 2-ounce bag of pork rinds. You'll save 230 calories.  . Have a hamburger that's 3 ounces instead of 6 ounces. Add a  cup serving of carrots and a 1/2 cup serving of spinach. You'll save more than 200 calories.  . Instead of 5 ounces of chicken, have a stir fry with 2 ounces ofchicken and 1 and 1/2 cups of raw vegetables. Use a small amount of vegetable oil. You'll save 50 calories.  . Have a 1/2 cup serving of low-fat frozen yogurt instead of a 1 and 1/2 ounce milk chocolate bar. You'll save about 110 calories.  . Use low-fat or fat-free condiments, such as fat free salad dressings.  . Eat smaller portions--cut back gradually.  . Use food labels to compare fat content in packaged foods. Items marked low-fat or fat-free may be lower in fat without being lower in calories than their regular versions.  . Limit foods with lots of added sugar, such as pies, flavored yogurts, candy bars, ice cream, sherbet, regular soft drinks, and fruit drinks.  . Drink water  or club soda instead of cola or other soda drinks.    

## 2022-05-21 NOTE — Progress Notes (Signed)
Date:  05/21/2022   Name:  Shannon Barton   DOB:  1956/03/23   MRN:  [id number]   Chief Complaint: Hypertension  Hypertension This is a new problem. The current episode started more than 1 month ago. The problem has been gradually improving since onset. Pertinent negatives include no anxiety, blurred vision, chest pain, headaches, palpitations, peripheral edema or shortness of breath. There are no associated agents to hypertension. Past treatments include angiotensin blockers (irbesartan started). There is no history of kidney disease, CAD/MI or CVA.    Lab Results  Component Value Date   NA 140 06/17/2016   K 4.8 06/17/2016   CO2 23 06/17/2016   GLUCOSE 106 (H) 06/17/2016   BUN 13 06/17/2016   CREATININE 0.71 06/17/2016   CALCIUM 9.8 06/17/2016   GFRNONAA 94 06/17/2016   Lab Results  Component Value Date   CHOL 238 (H) 06/17/2016   HDL 36 (L) 06/17/2016   LDLCALC 186 (H) 06/17/2016   TRIG 80 06/17/2016   CHOLHDL 6.6 (H) 06/17/2016   Lab Results  Component Value Date   TSH 0.835 06/17/2016   No results found for: "HGBA1C" Lab Results  Component Value Date   WBC 10.8 06/17/2016   HGB 14.4 06/17/2016   HCT 43.0 06/17/2016   MCV 88 06/17/2016   PLT 318 06/17/2016   Lab Results  Component Value Date   ALT 11 06/17/2016   AST 18 06/17/2016   ALKPHOS 79 06/17/2016   BILITOT <0.2 06/17/2016   No results found for: "25OHVITD2", "25OHVITD3", "VD25OH"   Review of Systems  Constitutional:  Negative for chills, fatigue and fever.  Eyes:  Negative for blurred vision.  Respiratory:  Negative for chest tightness, shortness of breath and wheezing.   Cardiovascular:  Negative for chest pain, palpitations and leg swelling.  Neurological:  Negative for dizziness, light-headedness and headaches.    Patient Active Problem List   Diagnosis Date Noted   Special screening for malignant neoplasms, colon 05/03/2022   Polyp of sigmoid colon 05/03/2022   Essential  hypertension 04/09/2022   Mixed hyperlipidemia 11/10/2016   Tobacco use disorder 06/13/2016   BMI 30.0-30.9,adult 06/13/2016    Allergies  Allergen Reactions   Atorvastatin Other (See Comments)    Abdominal pain    Past Surgical History:  Procedure Laterality Date   COLONOSCOPY WITH PROPOFOL N/A 05/03/2022   Procedure: COLONOSCOPY WITH PROPOFOL with polypectomy;  Surgeon: Lucilla Lame, MD;  Location: [facility];  Service: Endoscopy;  Laterality: N/A;   TONSILLECTOMY  1963    Social History   Tobacco Use   Smoking status: Every Day    Packs/day: 0.50    Years: 30.00    Total pack years: 15.00    Types: Cigarettes   Smokeless tobacco: Never  Vaping Use   Vaping Use: Never used  Substance Use Topics   Alcohol use: No   Drug use: No     Medication list has been reviewed and updated.  Current Meds  Medication Sig   [DISCONTINUED] irbesartan (AVAPRO) 150 MG tablet Take 1 tablet (150 mg total) by mouth daily.       05/21/2022    2:28 PM 04/09/2022    3:35 PM  GAD 7 : Generalized Anxiety Score  Nervous, Anxious, on Edge 0 0  Control/stop worrying 0 0  Worry too much - different things 0 0  Trouble relaxing 0 0  Restless 0 0  Easily annoyed or irritable 0 0  Afraid - awful  might happen 0 0  Total GAD 7 Score 0 0  Anxiety Difficulty Not difficult at all Not difficult at all       05/21/2022    2:28 PM 04/09/2022    3:35 PM 09/07/2017    1:44 PM  Depression screen PHQ 2/9  Decreased Interest 0 0 0  Down, Depressed, Hopeless 0 0 0  PHQ - 2 Score 0 0 0  Altered sleeping 0 0   Tired, decreased energy 0 0   Change in appetite 0 0   Feeling bad or failure about yourself  0 0   Trouble concentrating 0 0   Moving slowly or fidgety/restless 0 0   Suicidal thoughts 0 0   PHQ-9 Score 0 0   Difficult doing work/chores Not difficult at all Not difficult at all     BP Readings from Last 3 Encounters:  05/21/22 (!) 142/78  05/03/22 123/74  04/09/22 (!) 160/90     Physical Exam Vitals and nursing note reviewed.  Constitutional:      General: She is not in acute distress.    Appearance: Normal appearance. She is well-developed.  HENT:     Head: Normocephalic and atraumatic.  Cardiovascular:     Rate and Rhythm: Normal rate and regular rhythm.  Pulmonary:     Effort: Pulmonary effort is normal. No respiratory distress.     Breath sounds: No wheezing or rhonchi.  Musculoskeletal:     Cervical back: Normal range of motion.     Right lower leg: No edema.     Left lower leg: No edema.  Lymphadenopathy:     Cervical: No cervical adenopathy.  Skin:    General: Skin is warm and dry.     Capillary Refill: Capillary refill takes less than 2 seconds.     Findings: No rash.  Neurological:     Mental Status: She is alert and oriented to person, place, and time.  Psychiatric:        Mood and Affect: Mood normal.        Behavior: Behavior normal.     Wt Readings from Last 3 Encounters:  05/21/22 168 lb (76.2 kg)  05/03/22 163 lb (73.9 kg)  04/09/22 170 lb (77.1 kg)    BP (!) 142/78 (BP Location: Right Arm, Cuff Size: Normal)   Pulse 87   Ht 5' 4"$  (1.626 m)   Wt 168 lb (76.2 kg)   SpO2 97%   BMI 28.84 kg/m   Assessment and Plan: Problem List Items Addressed This Visit       Cardiovascular and Mediastinum   Essential hypertension - Primary    BP improved on Avapro 150 mg. No side effects noted.  Recommend screening labs Increase dose to 300 mg daily Follow up in 2 months.  Call if medication issues occur.      Relevant Medications   irbesartan (AVAPRO) 300 MG tablet   Other Relevant Orders   CBC with Differential/Platelet   Comprehensive metabolic panel   TSH     Partially dictated using Editor, commissioning. Any errors are unintentional.  Halina Maidens, MD [facility]  05/21/2022

## 2022-05-22 LAB — CBC WITH DIFFERENTIAL/PLATELET
Basophils Absolute: 0.1 10*3/uL (ref 0.0–0.2)
Basos: 1 %
EOS (ABSOLUTE): 0.2 10*3/uL (ref 0.0–0.4)
Eos: 2 %
Hematocrit: 40.3 % (ref 34.0–46.6)
Hemoglobin: 13.2 g/dL (ref 11.1–15.9)
Immature Grans (Abs): 0 10*3/uL (ref 0.0–0.1)
Immature Granulocytes: 0 %
Lymphocytes Absolute: 3 10*3/uL (ref 0.7–3.1)
Lymphs: 28 %
MCH: 29.3 pg (ref 26.6–33.0)
MCHC: 32.8 g/dL (ref 31.5–35.7)
MCV: 89 fL (ref 79–97)
Monocytes Absolute: 0.8 10*3/uL (ref 0.1–0.9)
Monocytes: 8 %
Neutrophils Absolute: 6.6 10*3/uL (ref 1.4–7.0)
Neutrophils: 61 %
Platelets: 315 10*3/uL (ref 150–450)
RBC: 4.51 x10E6/uL (ref 3.77–5.28)
RDW: 12.8 % (ref 11.7–15.4)
WBC: 10.6 10*3/uL (ref 3.4–10.8)

## 2022-05-22 LAB — COMPREHENSIVE METABOLIC PANEL
ALT: 12 IU/L (ref 0–32)
AST: 16 IU/L (ref 0–40)
Albumin/Globulin Ratio: 1.3 (ref 1.2–2.2)
Albumin: 4.2 g/dL (ref 3.9–4.9)
Alkaline Phosphatase: 87 IU/L (ref 44–121)
BUN/Creatinine Ratio: 18 (ref 12–28)
BUN: 14 mg/dL (ref 8–27)
Bilirubin Total: 0.2 mg/dL (ref 0.0–1.2)
CO2: 19 mmol/L — ABNORMAL LOW (ref 20–29)
Calcium: 9.7 mg/dL (ref 8.7–10.3)
Chloride: 102 mmol/L (ref 96–106)
Creatinine, Ser: 0.78 mg/dL (ref 0.57–1.00)
Globulin, Total: 3.3 g/dL (ref 1.5–4.5)
Glucose: 98 mg/dL (ref 70–99)
Potassium: 4.7 mmol/L (ref 3.5–5.2)
Sodium: 140 mmol/L (ref 134–144)
Total Protein: 7.5 g/dL (ref 6.0–8.5)
eGFR: 84 mL/min/{1.73_m2} (ref >59)

## 2022-05-22 LAB — TSH: TSH: 0.773 u[IU]/mL (ref 0.450–4.500)

## 2022-06-05 ENCOUNTER — Other Ambulatory Visit: Payer: Self-pay | Admitting: Internal Medicine

## 2022-06-05 DIAGNOSIS — I10 Essential (primary) hypertension: Secondary | ICD-10-CM

## 2022-06-07 NOTE — Telephone Encounter (Signed)
Unable to refill per protocol, Rx request is too soon. Last refill 05/1622 for 30 and 1refill.  Requested Prescriptions  Pending Prescriptions Disp Refills   irbesartan (AVAPRO) 150 MG tablet [Pharmacy Med Name: IRBESARTAN 150 MG TABLET] 30 tablet 1    Sig: TAKE 1 TABLET BY MOUTH EVERY DAY     Cardiovascular:  Angiotensin Receptor Blockers Failed - 06/05/2022  8:55 AM      Failed - Last BP in normal range    BP Readings from Last 1 Encounters:  05/21/22 (!) 142/78         Passed - Cr in normal range and within 180 days    Creatinine, Ser  Date Value Ref Range Status  05/21/2022 0.78 0.57 - 1.00 mg/dL Final         Passed - K in normal range and within 180 days    Potassium  Date Value Ref Range Status  05/21/2022 4.7 3.5 - 5.2 mmol/L Final         Passed - Patient is not pregnant      Passed - Valid encounter within last 6 months    Recent Outpatient Visits           2 weeks ago Essential hypertension   [place] [facility] [place], Jesse Sans, MD   1 month ago Essential hypertension   [place] [facility] [place], Jesse Sans, MD   4 years ago Cellulitis and abscess of right leg   [facility] [place] [facility], Jesse Sans, MD   5 years ago Tobacco use disorder   [facility] [place] [facility], Jesse Sans, MD   5 years ago Annual physical exam   [place] at [facility], Jesse Sans, MD       Future Appointments             In 1 month [facility] Melia, Jesse Sans, MD [place] [facility] [place], [facility]   In 5 months Army Melia, Jesse Sans, MD [place] at [facility], [facility]

## 2022-06-16 ENCOUNTER — Telehealth: Payer: Self-pay

## 2022-06-16 NOTE — Telephone Encounter (Signed)
Called pt left VM to call back to schedule an AWV. If pt has done one before she can do it over the phone if not she has to schedule an appointment for her first visit and then after she can do it on the phone.  KP

## 2022-07-21 ENCOUNTER — Other Ambulatory Visit: Payer: Self-pay

## 2022-07-21 DIAGNOSIS — I10 Essential (primary) hypertension: Secondary | ICD-10-CM

## 2022-07-21 MED ORDER — IRBESARTAN 300 MG PO TABS: 300.0000 mg | ORAL_TABLET | Freq: Every day | ORAL | 0 refills | Status: DC

## 2022-07-23 ENCOUNTER — Encounter: Payer: Self-pay | Admitting: Internal Medicine

## 2022-07-23 ENCOUNTER — Ambulatory Visit (INDEPENDENT_AMBULATORY_CARE_PROVIDER_SITE_OTHER): Payer: No Typology Code available for payment source | Admitting: Internal Medicine

## 2022-07-23 VITALS — BP 136/86 | HR 63 | Ht 64.0 in | Wt 168.0 lb

## 2022-07-23 DIAGNOSIS — I10 Essential (primary) hypertension: Secondary | ICD-10-CM

## 2022-07-23 MED ORDER — HYDROCHLOROTHIAZIDE 25 MG PO TABS: 25.0000 mg | ORAL_TABLET | Freq: Every day | ORAL | 0 refills | Status: DC

## 2022-07-23 NOTE — Patient Instructions (Signed)
-  It was a pleasure to see you today! Please review your visit summary for helpful information -Lab results are usually available within 1-2 days and we will call once reviewed -I would encourage you to follow your care via MyChart where you can access lab results, notes, messages, and more -If you feel that we did a nice job today, please complete your after-visit survey and leave [place] a Google review! Your CMA today was Chassidy and your provider was Bari Edward, MD. -Please return for follow-up in about 2 months.

## 2022-07-23 NOTE — Assessment & Plan Note (Addendum)
Clinically stable exam with improved control of BP on Avapro 300 mg. Tolerating medications without side effects. Will add HCTZ for additional control. Pt to continue current regimen and low sodium diet. EKG - SR @ 59 with occasional PACs

## 2022-07-23 NOTE — Progress Notes (Signed)
Date:  07/23/2022   Name:  Shannon Barton   DOB:  [birth year]   MRN:  [id number]   Chief Complaint: Hypertension  Hypertension This is a chronic problem. The problem is controlled. Pertinent negatives include no chest pain, headaches, palpitations or shortness of breath. Past treatments include angiotensin blockers. The current treatment provides moderate improvement. There is no history of kidney disease, CAD/MI or CVA.    Lab Results  Component Value Date   NA 140 05/21/2022   K 4.7 05/21/2022   CO2 19 (L) 05/21/2022   GLUCOSE 98 05/21/2022   BUN 14 05/21/2022   CREATININE 0.78 05/21/2022   CALCIUM 9.7 05/21/2022   EGFR 84 05/21/2022   GFRNONAA 94 06/17/2016   Lab Results  Component Value Date   CHOL 238 (H) 06/17/2016   HDL 36 (L) 06/17/2016   LDLCALC 186 (H) 06/17/2016   TRIG 80 06/17/2016   CHOLHDL 6.6 (H) 06/17/2016   Lab Results  Component Value Date   TSH 0.773 05/21/2022   No results found for: "HGBA1C" Lab Results  Component Value Date   WBC 10.6 05/21/2022   HGB 13.2 05/21/2022   HCT 40.3 05/21/2022   MCV 89 05/21/2022   PLT 315 05/21/2022   Lab Results  Component Value Date   ALT 12 05/21/2022   AST 16 05/21/2022   ALKPHOS 87 05/21/2022   BILITOT 0.2 05/21/2022   No results found for: "25OHVITD2", "25OHVITD3", "VD25OH"   Review of Systems  Constitutional:  Negative for fatigue and unexpected weight change.  HENT:  Negative for nosebleeds.   Eyes:  Negative for visual disturbance.  Respiratory:  Negative for cough, chest tightness, shortness of breath and wheezing.   Cardiovascular:  Negative for chest pain, palpitations and leg swelling.  Gastrointestinal:  Negative for abdominal pain, constipation and diarrhea.  Neurological:  Negative for dizziness, weakness, light-headedness and headaches.  Psychiatric/Behavioral:  Negative for dysphoric mood and sleep disturbance. The patient is not nervous/anxious.     Patient Active Problem List    Diagnosis Date Noted   Special screening for malignant neoplasms, colon 05/03/2022   Polyp of sigmoid colon 05/03/2022   Essential hypertension 04/09/2022   Mixed hyperlipidemia 11/10/2016   Tobacco use disorder 06/13/2016   BMI 30.0-30.9,adult 06/13/2016    Allergies  Allergen Reactions   Atorvastatin Other (See Comments)    Abdominal pain    Past Surgical History:  Procedure Laterality Date   COLONOSCOPY WITH PROPOFOL N/A 05/03/2022   Procedure: COLONOSCOPY WITH PROPOFOL with polypectomy;  Surgeon: Midge Minium, MD;  Location: [facility];  Service: Endoscopy;  Laterality: N/A;   TONSILLECTOMY  1963    Social History   Tobacco Use   Smoking status: Every Day    Packs/day: 0.50    Years: 32.00    Additional pack years: 0.00    Total pack years: 16.00    Types: Cigarettes   Smokeless tobacco: Never  Vaping Use   Vaping Use: Never used  Substance Use Topics   Alcohol use: No   Drug use: No     Medication list has been reviewed and updated.  Current Meds  Medication Sig   hydrochlorothiazide (HYDRODIURIL) 25 MG tablet Take 1 tablet (25 mg total) by mouth daily.   irbesartan (AVAPRO) 300 MG tablet Take 1 tablet (300 mg total) by mouth daily.       07/23/2022    1:28 PM 05/21/2022    2:28 PM 04/09/2022    3:35 PM  GAD 7 : Generalized Anxiety Score  Nervous, Anxious, on Edge 0 0 0  Control/stop worrying 0 0 0  Worry too much - different things 0 0 0  Trouble relaxing 0 0 0  Restless 0 0 0  Easily annoyed or irritable 0 0 0  Afraid - awful might happen 0 0 0  Total GAD 7 Score 0 0 0  Anxiety Difficulty Not difficult at all Not difficult at all Not difficult at all       07/23/2022    1:28 PM 05/21/2022    2:28 PM 04/09/2022    3:35 PM  Depression screen PHQ 2/9  Decreased Interest 0 0 0  Down, Depressed, Hopeless 0 0 0  PHQ - 2 Score 0 0 0  Altered sleeping 0 0 0  Tired, decreased energy 0 0 0  Change in appetite 0 0 0  Feeling bad or failure  about yourself  0 0 0  Trouble concentrating 0 0 0  Moving slowly or fidgety/restless 0 0 0  Suicidal thoughts 0 0 0  PHQ-9 Score 0 0 0  Difficult doing work/chores Not difficult at all Not difficult at all Not difficult at all    BP Readings from Last 3 Encounters:  07/23/22 136/86  05/21/22 (!) 142/78  05/03/22 123/74    Physical Exam Vitals and nursing note reviewed.  Constitutional:      General: She is not in acute distress.    Appearance: She is well-developed.  HENT:     Head: Normocephalic and atraumatic.  Cardiovascular:     Rate and Rhythm: Normal rate and regular rhythm. Occasional Extrasystoles are present. Pulmonary:     Effort: Pulmonary effort is normal. No respiratory distress.  Musculoskeletal:        General: Normal range of motion.     Cervical back: Normal range of motion.     Right lower leg: No edema.     Left lower leg: No edema.  Lymphadenopathy:     Cervical: No cervical adenopathy.  Skin:    General: Skin is warm and dry.     Capillary Refill: Capillary refill takes less than 2 seconds.     Findings: No rash.  Neurological:     Mental Status: She is alert and oriented to person, place, and time.  Psychiatric:        Mood and Affect: Mood normal.        Behavior: Behavior normal.     Wt Readings from Last 3 Encounters:  07/23/22 168 lb (76.2 kg)  05/21/22 168 lb (76.2 kg)  05/03/22 163 lb (73.9 kg)    BP 136/86 (BP Location: Left Arm, Cuff Size: Large)   Pulse 63   Ht 5\' 4"  (1.626 m)   Wt 168 lb (76.2 kg)   SpO2 96%   BMI 28.84 kg/m   Assessment and Plan:  Problem List Items Addressed This Visit       Cardiovascular and Mediastinum   Essential hypertension - Primary    Clinically stable exam with improved control of BP on Avapro 300 mg. Tolerating medications without side effects. Will add HCTZ for additional control. Pt to continue current regimen and low sodium diet. EKG - SR @ 59 with occasional PACs      Relevant  Medications   hydrochlorothiazide (HYDRODIURIL) 25 MG tablet   Other Relevant Orders   EKG 12-Lead (Completed)    Return in about 2 months (around 09/22/2022) for HTN.   Partially dictated using  Animal nutritionist, any errors are not intentional.  Reubin Milan, MD [facility] [place], [place]

## 2022-07-30 ENCOUNTER — Ambulatory Visit
Admission: RE | Admit: 2022-07-30 | Discharge: 2022-07-30 | Disposition: A | Discharge status: Home / Self Care | Payer: No Typology Code available for payment source | Source: Ambulatory Visit | Attending: Internal Medicine | Admitting: Internal Medicine

## 2022-07-30 DIAGNOSIS — Z1231 Encounter for screening mammogram for malignant neoplasm of breast: Secondary | ICD-10-CM | POA: Diagnosis not present

## 2022-09-14 ENCOUNTER — Telehealth: Payer: Self-pay

## 2022-09-14 NOTE — Telephone Encounter (Signed)
The patient states she just want to hold off on her AWV for now and just come to her physical in August. Please assist patient further if necessary.

## 2022-09-14 NOTE — Telephone Encounter (Signed)
Called pt could not leave VM to call back. Pt needs to schedule AWV. Please forward the call to the office. Ask to speak to [facility] to schedule.  KP

## 2022-09-24 ENCOUNTER — Ambulatory Visit: Payer: No Typology Code available for payment source | Admitting: Internal Medicine

## 2022-10-15 ENCOUNTER — Ambulatory Visit (INDEPENDENT_AMBULATORY_CARE_PROVIDER_SITE_OTHER): Payer: No Typology Code available for payment source | Admitting: Internal Medicine

## 2022-10-15 ENCOUNTER — Encounter: Payer: Self-pay | Admitting: Internal Medicine

## 2022-10-15 VITALS — BP 102/70 | HR 56 | Ht 64.0 in | Wt 166.0 lb

## 2022-10-15 DIAGNOSIS — I10 Essential (primary) hypertension: Secondary | ICD-10-CM | POA: Diagnosis not present

## 2022-10-15 MED ORDER — IRBESARTAN-HYDROCHLOROTHIAZIDE 300-12.5 MG PO TABS: 1.0000 | ORAL_TABLET | Freq: Every day | ORAL | 1 refills | Status: DC

## 2022-10-15 NOTE — Assessment & Plan Note (Addendum)
Stable exam with improved BP.  Currently taking Avapro and hctz.   Tolerating medications without concerns or side effects. However, BP fairly low today. Will continue to recommend low sodium diet and reduce hydrochlorothiazide to 12.5 mg daily.

## 2022-10-15 NOTE — Patient Instructions (Signed)
Take irbesartan 300 mg with one half of the HCTZ daily.  When that runs out, start the One pill that is the combo.

## 2022-10-15 NOTE — Progress Notes (Signed)
Date:  10/15/2022   Name:  Shannon Barton   DOB:  [birth year]   MRN:  [id number]   Chief Complaint: Hypertension  Hypertension This is a chronic problem. The problem has been gradually improving since onset. The problem is controlled. Pertinent negatives include no chest pain or shortness of breath. Past treatments include angiotensin blockers and diuretics (hctz added last visit). Improvement on treatment: she has not checked her BP. There are no compliance problems.     Lab Results  Component Value Date   NA 140 05/21/2022   K 4.7 05/21/2022   CO2 19 (L) 05/21/2022   GLUCOSE 98 05/21/2022   BUN 14 05/21/2022   CREATININE 0.78 05/21/2022   CALCIUM 9.7 05/21/2022   EGFR 84 05/21/2022   GFRNONAA 94 06/17/2016   Lab Results  Component Value Date   CHOL 238 (H) 06/17/2016   HDL 36 (L) 06/17/2016   LDLCALC 186 (H) 06/17/2016   TRIG 80 06/17/2016   CHOLHDL 6.6 (H) 06/17/2016   Lab Results  Component Value Date   TSH 0.773 05/21/2022   No results found for: "HGBA1C" Lab Results  Component Value Date   WBC 10.6 05/21/2022   HGB 13.2 05/21/2022   HCT 40.3 05/21/2022   MCV 89 05/21/2022   PLT 315 05/21/2022   Lab Results  Component Value Date   ALT 12 05/21/2022   AST 16 05/21/2022   ALKPHOS 87 05/21/2022   BILITOT 0.2 05/21/2022   No results found for: "25OHVITD2", "25OHVITD3", "VD25OH"   Review of Systems  Constitutional:  Negative for fatigue.  Respiratory:  Negative for chest tightness and shortness of breath.   Cardiovascular:  Negative for chest pain and leg swelling.  Musculoskeletal:  Negative for myalgias.  Neurological:  Negative for dizziness and light-headedness.  Psychiatric/Behavioral:  Negative for sleep disturbance.     Patient Active Problem List   Diagnosis Date Noted   Special screening for malignant neoplasms, colon 05/03/2022   Polyp of sigmoid colon 05/03/2022   Essential hypertension 04/09/2022   Mixed hyperlipidemia 11/10/2016    Tobacco use disorder 06/13/2016   BMI 30.0-30.9,adult 06/13/2016    Allergies  Allergen Reactions   Atorvastatin Other (See Comments)    Abdominal pain    Past Surgical History:  Procedure Laterality Date   COLONOSCOPY WITH PROPOFOL N/A 05/03/2022   Procedure: COLONOSCOPY WITH PROPOFOL with polypectomy;  Surgeon: Midge Minium, MD;  Location: [facility];  Service: Endoscopy;  Laterality: N/A;   TONSILLECTOMY  1963    Social History   Tobacco Use   Smoking status: Every Day    Current packs/day: 0.50    Average packs/day: 0.5 packs/day for 32.0 years (16.0 ttl pk-yrs)    Types: Cigarettes   Smokeless tobacco: Never  Vaping Use   Vaping status: Never Used  Substance Use Topics   Alcohol use: No   Drug use: No     Medication list has been reviewed and updated.  Current Meds  Medication Sig   hydrochlorothiazide (HYDRODIURIL) 25 MG tablet Take 1 tablet (25 mg total) by mouth daily.   irbesartan (AVAPRO) 300 MG tablet Take 1 tablet (300 mg total) by mouth daily.   irbesartan-hydrochlorothiazide (AVALIDE) 300-12.5 MG tablet Take 1 tablet by mouth daily.       10/15/2022    2:44 PM 07/23/2022    1:28 PM 05/21/2022    2:28 PM 04/09/2022    3:35 PM  GAD 7 : Generalized Anxiety Score  Nervous, Anxious,  on Edge 0 0 0 0  Control/stop worrying 0 0 0 0  Worry too much - different things 0 0 0 0  Trouble relaxing 0 0 0 0  Restless 0 0 0 0  Easily annoyed or irritable 0 0 0 0  Afraid - awful might happen 0 0 0 0  Total GAD 7 Score 0 0 0 0  Anxiety Difficulty Not difficult at all Not difficult at all Not difficult at all Not difficult at all       10/15/2022    2:43 PM 07/23/2022    1:28 PM 05/21/2022    2:28 PM  Depression screen PHQ 2/9  Decreased Interest 0 0 0  Down, Depressed, Hopeless 0 0 0  PHQ - 2 Score 0 0 0  Altered sleeping 0 0 0  Tired, decreased energy 0 0 0  Change in appetite 0 0 0  Feeling bad or failure about yourself  0 0 0  Trouble  concentrating 0 0 0  Moving slowly or fidgety/restless 0 0 0  Suicidal thoughts 0 0 0  PHQ-9 Score 0 0 0  Difficult doing work/chores Not difficult at all Not difficult at all Not difficult at all    BP Readings from Last 3 Encounters:  10/15/22 102/70  07/23/22 136/86  05/21/22 (!) 142/78    Physical Exam Vitals and nursing note reviewed.  Constitutional:      General: She is not in acute distress.    Appearance: Normal appearance. She is well-developed.  HENT:     Head: Normocephalic and atraumatic.  Cardiovascular:     Rate and Rhythm: Normal rate and regular rhythm.     Heart sounds: No murmur heard. Pulmonary:     Effort: Pulmonary effort is normal. No respiratory distress.     Breath sounds: No wheezing or rhonchi.  Musculoskeletal:     Right lower leg: No edema.     Left lower leg: No edema.  Skin:    General: Skin is warm and dry.     Findings: No rash.  Neurological:     Mental Status: She is alert and oriented to person, place, and time.  Psychiatric:        Mood and Affect: Mood normal.        Behavior: Behavior normal.     Wt Readings from Last 3 Encounters:  10/15/22 166 lb (75.3 kg)  07/23/22 168 lb (76.2 kg)  05/21/22 168 lb (76.2 kg)    BP 102/70 (BP Location: Left Arm, Cuff Size: Normal)   Pulse (!) 56   Ht 5\' 4"  (1.626 m)   Wt 166 lb (75.3 kg)   SpO2 95%   BMI 28.49 kg/m   Assessment and Plan:  Problem List Items Addressed This Visit     Essential hypertension - Primary    Stable exam with improved BP.  Currently taking Avapro and hctz.   Tolerating medications without concerns or side effects. However, BP fairly low today. Will continue to recommend low sodium diet and reduce hydrochlorothiazide to 12.5 mg daily.       Relevant Medications   irbesartan-hydrochlorothiazide (AVALIDE) 300-12.5 MG tablet    No follow-ups on file.   Partially dictated using Dragon software, any errors are not intentional.  Reubin Milan,  MD [facility] [place], [place]

## 2022-11-16 ENCOUNTER — Other Ambulatory Visit: Payer: Self-pay | Admitting: Internal Medicine

## 2022-11-16 ENCOUNTER — Telehealth: Payer: Self-pay | Admitting: Internal Medicine

## 2022-11-16 DIAGNOSIS — I10 Essential (primary) hypertension: Secondary | ICD-10-CM

## 2022-11-16 NOTE — Telephone Encounter (Signed)
Medication Refill - Medication: irbesartan (AVAPRO) 300 MG tablet  Has the patient contacted their pharmacy? yes (Agent: If no, request that the patient contact the pharmacy for the refill. If patient does not wish to contact the pharmacy document the reason why and proceed with request.) (Agent: If yes, when and what did the pharmacy advise?)[facility] called in  Preferred Pharmacy (with phone number or street name):  [id number] - [place], [place] - [address] Phone: [phone]  Fax: [phone]     Has the patient been seen for an appointment in the last year OR does the patient have an upcoming appointment? yes  Agent: Please be advised that RX refills may take up to 3 business days. We ask that you follow-up with your pharmacy.

## 2022-11-17 MED ORDER — IRBESARTAN 300 MG PO TABS: 300.0000 mg | ORAL_TABLET | Freq: Every day | ORAL | 0 refills | Status: DC

## 2022-11-17 NOTE — Telephone Encounter (Signed)
Requested Prescriptions  Pending Prescriptions Disp Refills   irbesartan (AVAPRO) 300 MG tablet 100 tablet 0    Sig: Take 1 tablet (300 mg total) by mouth daily.     Cardiovascular:  Angiotensin Receptor Blockers Passed - 11/16/2022  9:04 AM      Passed - Cr in normal range and within 180 days    Creatinine, Ser  Date Value Ref Range Status  05/21/2022 0.78 0.57 - 1.00 mg/dL Final         Passed - K in normal range and within 180 days    Potassium  Date Value Ref Range Status  05/21/2022 4.7 3.5 - 5.2 mmol/L Final         Passed - Patient is not pregnant      Passed - Last BP in normal range    BP Readings from Last 1 Encounters:  10/15/22 102/70         Passed - Valid encounter within last 6 months    Recent Outpatient Visits           1 month ago Essential hypertension   [place] [facility] at [facility], Nyoka Cowden, MD   3 months ago Essential hypertension   [place] [facility] at [facility], Nyoka Cowden, MD   6 months ago Essential hypertension   [place] [facility] at [facility], Nyoka Cowden, MD   7 months ago Essential hypertension   [place] [facility] at [facility], Nyoka Cowden, MD   5 years ago Cellulitis and abscess of right leg   [facility] at [facility], Nyoka Cowden, MD       Future Appointments             In 2 days Judithann Graves Nyoka Cowden, MD [facility] at [facility], [facility]

## 2022-11-19 ENCOUNTER — Ambulatory Visit (INDEPENDENT_AMBULATORY_CARE_PROVIDER_SITE_OTHER): Payer: No Typology Code available for payment source | Admitting: Internal Medicine

## 2022-11-19 ENCOUNTER — Encounter: Payer: Self-pay | Admitting: Internal Medicine

## 2022-11-19 VITALS — BP 129/66 | HR 51 | Ht 64.0 in | Wt 166.0 lb

## 2022-11-19 DIAGNOSIS — Z23 Encounter for immunization: Secondary | ICD-10-CM

## 2022-11-19 DIAGNOSIS — E782 Mixed hyperlipidemia: Secondary | ICD-10-CM | POA: Diagnosis not present

## 2022-11-19 DIAGNOSIS — R21 Rash and other nonspecific skin eruption: Secondary | ICD-10-CM

## 2022-11-19 DIAGNOSIS — Z0000 Encounter for general adult medical examination without abnormal findings: Secondary | ICD-10-CM

## 2022-11-19 DIAGNOSIS — I10 Essential (primary) hypertension: Secondary | ICD-10-CM | POA: Diagnosis not present

## 2022-11-19 DIAGNOSIS — Z13820 Encounter for screening for osteoporosis: Secondary | ICD-10-CM

## 2022-11-19 LAB — POCT URINALYSIS DIPSTICK
Bilirubin, UA: NEGATIVE
Blood, UA: NEGATIVE
Glucose, UA: NEGATIVE
Ketones, UA: NEGATIVE
Leukocytes, UA: NEGATIVE
Nitrite, UA: NEGATIVE
Protein, UA: POSITIVE — AB
Spec Grav, UA: 1.015 (ref 1.010–1.025)
Urobilinogen, UA: 0.2 E.U./dL
pH, UA: 6 (ref 5.0–8.0)

## 2022-11-19 MED ORDER — IRBESARTAN-HYDROCHLOROTHIAZIDE 300-12.5 MG PO TABS: 1.0000 | ORAL_TABLET | Freq: Every day | ORAL | 1 refills | Status: DC

## 2022-11-19 MED ORDER — SHINGRIX 50 MCG/0.5ML IM SUSR: 0.5000 mL | Freq: Once | INTRAMUSCULAR | 1 refills | Status: AC

## 2022-11-19 MED ORDER — TRIAMCINOLONE ACETONIDE 0.1 % EX CREA: 1.0000 | TOPICAL_CREAM | Freq: Two times a day (BID) | CUTANEOUS | 0 refills | Status: DC

## 2022-11-19 NOTE — Progress Notes (Signed)
Date:  11/19/2022   Name:  Shannon Barton   DOB:  1955/10/14   MRN:  [id number]   Chief Complaint: Annual Exam (Wants to go back to taking 1 tablet for BP instead of two. Currently taking hydrochlorothiazide and Irbesartan. Wants to take the combo pill. ) Shannon Barton is a 67 y.o. female who presents today for her Complete Annual Exam. She feels well. She reports gardening. She reports she is sleeping well. Breast complaints - none.  Mammogram: 07/2022 DEXA: none Pap smear: discontinued Colonoscopy: 04/2022  Health Maintenance Due  Topic Date Due   DTaP/Tdap/Td (1 - Tdap) Never done   Lung Cancer Screening  Never done   Zoster Vaccines- Shingrix (1 of 2) Never done   PAP SMEAR-Modifier  06/15/2019   COVID-19 Vaccine (3 - 2023-24 season) 12/04/2021   DEXA SCAN  Never done   INFLUENZA VACCINE  11/04/2022    Immunization History  Administered Date(s) Administered   Fluad Quad(high Dose 65+) 04/09/2022   PFIZER(Purple Top)SARS-COV-2 Vaccination 06/16/2019, 07/11/2019   PNEUMOCOCCAL CONJUGATE-20 04/09/2022    Hypertension This is a chronic problem. The problem is controlled. Pertinent negatives include no chest pain, headaches, palpitations or shortness of breath. Past treatments include angiotensin blockers and diuretics. The current treatment provides significant improvement. There is no history of kidney disease, CAD/MI or CVA.    Lab Results  Component Value Date   NA 140 05/21/2022   K 4.7 05/21/2022   CO2 19 (L) 05/21/2022   GLUCOSE 98 05/21/2022   BUN 14 05/21/2022   CREATININE 0.78 05/21/2022   CALCIUM 9.7 05/21/2022   EGFR 84 05/21/2022   GFRNONAA 94 06/17/2016   Lab Results  Component Value Date   CHOL 238 (H) 06/17/2016   HDL 36 (L) 06/17/2016   LDLCALC 186 (H) 06/17/2016   TRIG 80 06/17/2016   CHOLHDL 6.6 (H) 06/17/2016   Lab Results  Component Value Date   TSH 0.773 05/21/2022   No results found for: "HGBA1C" Lab Results  Component Value  Date   WBC 10.6 05/21/2022   HGB 13.2 05/21/2022   HCT 40.3 05/21/2022   MCV 89 05/21/2022   PLT 315 05/21/2022   Lab Results  Component Value Date   ALT 12 05/21/2022   AST 16 05/21/2022   ALKPHOS 87 05/21/2022   BILITOT 0.2 05/21/2022   No results found for: "25OHVITD2", "25OHVITD3", "VD25OH"   Review of Systems  Constitutional:  Negative for chills, fatigue and fever.  HENT:  Negative for congestion, hearing loss, tinnitus, trouble swallowing and voice change.   Eyes:  Negative for visual disturbance.  Respiratory:  Negative for cough, chest tightness, shortness of breath and wheezing.   Cardiovascular:  Negative for chest pain, palpitations and leg swelling.  Gastrointestinal:  Negative for abdominal pain, constipation, diarrhea and vomiting.  Endocrine: Negative for polydipsia and polyuria.  Genitourinary:  Negative for dysuria, frequency, genital sores, vaginal bleeding and vaginal discharge.  Musculoskeletal:  Negative for arthralgias, gait problem and joint swelling.  Skin:  Positive for rash. Negative for color change.  Neurological:  Negative for dizziness, tremors, light-headedness and headaches.  Hematological:  Negative for adenopathy. Does not bruise/bleed easily.  Psychiatric/Behavioral:  Negative for dysphoric mood and sleep disturbance. The patient is not nervous/anxious.     Patient Active Problem List   Diagnosis Date Noted   Special screening for malignant neoplasms, colon 05/03/2022   Polyp of sigmoid colon 05/03/2022   Essential hypertension 04/09/2022   Mixed  hyperlipidemia 11/10/2016   Tobacco use disorder 06/13/2016   BMI 30.0-30.9,adult 06/13/2016    Allergies  Allergen Reactions   Atorvastatin Other (See Comments)    Abdominal pain    Past Surgical History:  Procedure Laterality Date   COLONOSCOPY WITH PROPOFOL N/A 05/03/2022   Procedure: COLONOSCOPY WITH PROPOFOL with polypectomy;  Surgeon: Midge Minium, MD;  Location[facility];   Service: Endoscopy;  Laterality: N/A;   TONSILLECTOMY  1963    Social History   Tobacco Use   Smoking status: Every Day    Current packs/day: 1.00    Average packs/day: 1 pack/day for 40.6 years (40.6 ttl pk-yrs)    Types: Cigarettes    Start date: 1984   Smokeless tobacco: Never  Vaping Use   Vaping status: Never Used  Substance Use Topics   Alcohol use: No   Drug use: No     Medication list has been reviewed and updated.  Current Meds  Medication Sig   hydrochlorothiazide (HYDRODIURIL) 25 MG tablet Take 1 tablet (25 mg total) by mouth daily.   irbesartan (AVAPRO) 300 MG tablet Take 1 tablet (300 mg total) by mouth daily.   triamcinolone cream (KENALOG) 0.1 % Apply 1 Application topically 2 (two) times daily. To rash on leg   Zoster Vaccine Adjuvanted [facility]) injection Inject 0.5 mLs into the muscle once for 1 dose.       10/15/2022    2:44 PM 07/23/2022    1:28 PM 05/21/2022    2:28 PM 04/09/2022    3:35 PM  GAD 7 : Generalized Anxiety Score  Nervous, Anxious, on Edge 0 0 0 0  Control/stop worrying 0 0 0 0  Worry too much - different things 0 0 0 0  Trouble relaxing 0 0 0 0  Restless 0 0 0 0  Easily annoyed or irritable 0 0 0 0  Afraid - awful might happen 0 0 0 0  Total GAD 7 Score 0 0 0 0  Anxiety Difficulty Not difficult at all Not difficult at all Not difficult at all Not difficult at all       11/19/2022   10:16 AM 10/15/2022    2:43 PM 07/23/2022    1:28 PM  Depression screen PHQ 2/9  Decreased Interest 0 0 0  Down, Depressed, Hopeless 0 0 0  PHQ - 2 Score 0 0 0  Altered sleeping 0 0 0  Tired, decreased energy 0 0 0  Change in appetite 0 0 0  Feeling bad or failure about yourself  0 0 0  Trouble concentrating 0 0 0  Moving slowly or fidgety/restless 0 0 0  Suicidal thoughts 0 0 0  PHQ-9 Score 0 0 0  Difficult doing work/chores Not difficult at all Not difficult at all Not difficult at all      11/19/2022   10:17 AM  6CIT Screen  What Year? 0  points  What month? 0 points  What time? 0 points  Count back from 20 0 points  Months in reverse 0 points  Repeat phrase 2 points  Total Score 2 points   Functional Status Survey: Is the patient deaf or have difficulty hearing?: No Does the patient have difficulty seeing, even when wearing glasses/contacts?: No Does the patient have difficulty concentrating, remembering, or making decisions?: No Does the patient have difficulty walking or climbing stairs?: No Does the patient have difficulty dressing or bathing?: No Does the patient have difficulty doing errands alone such as visiting  a doctor's office or shopping?: No  Medicare Risk at Home - 11/19/22 1017     Any stairs in or around the home? No    If so, are there any without handrails? No    Home free of loose throw rugs in walkways, pet beds, electrical cords, etc? Yes    Adequate lighting in your home to reduce risk of falls? Yes    Life alert? No    Use of a cane, walker or w/c? No    Grab bars in the bathroom? No    Shower chair or bench in shower? No    Elevated toilet seat or a handicapped toilet? No             Hearing Screening - Comments:: No Concerns Vision Screening - Comments:: No concerns  BP Readings from Last 3 Encounters:  11/19/22 129/66  10/15/22 102/70  07/23/22 136/86    Physical Exam Vitals and nursing note reviewed.  Constitutional:      General: She is not in acute distress.    Appearance: She is well-developed.  HENT:     Head: Normocephalic and atraumatic.     Right Ear: Tympanic membrane and ear canal normal.     Left Ear: Tympanic membrane and ear canal normal.     Nose:     Right Sinus: No maxillary sinus tenderness.     Left Sinus: No maxillary sinus tenderness.  Eyes:     General: No scleral icterus.       Right eye: No discharge.        Left eye: No discharge.     Conjunctiva/sclera: Conjunctivae normal.  Neck:     Thyroid: No thyromegaly.     Vascular: No carotid bruit.   Cardiovascular:     Rate and Rhythm: Normal rate and regular rhythm.     Pulses: Normal pulses.     Heart sounds: Normal heart sounds.  Pulmonary:     Effort: Pulmonary effort is normal. No respiratory distress.     Breath sounds: No wheezing.  Chest:  Breasts:    Right: No mass, nipple discharge, skin change or tenderness.     Left: No mass, nipple discharge, skin change or tenderness.  Abdominal:     General: Bowel sounds are normal.     Palpations: Abdomen is soft.     Tenderness: There is no abdominal tenderness.  Musculoskeletal:     Cervical back: Normal range of motion. No erythema.     Right lower leg: No edema.     Left lower leg: No edema.  Lymphadenopathy:     Cervical: No cervical adenopathy.  Skin:    General: Skin is warm and dry.     Findings: Rash present. Rash is scaling.          Comments: Eczemtous rash  Neurological:     Mental Status: She is alert and oriented to person, place, and time.     Cranial Nerves: No cranial nerve deficit.     Sensory: No sensory deficit.     Deep Tendon Reflexes: Reflexes are normal and symmetric.  Psychiatric:        Attention and Perception: Attention normal.        Mood and Affect: Mood normal.     Wt Readings from Last 3 Encounters:  11/19/22 166 lb (75.3 kg)  10/15/22 166 lb (75.3 kg)  07/23/22 168 lb (76.2 kg)    BP 129/66 (BP Location: Right Arm, Cuff Size: Large)  Pulse (!) 51   Ht 5\' 4"  (1.626 m)   Wt 166 lb (75.3 kg)   SpO2 97%   BMI 28.49 kg/m   Assessment and Plan:  Problem List Items Addressed This Visit       Unprioritized   Mixed hyperlipidemia (Chronic)    Managed with diet alone Statin recommended but pt prefers diet changes for now.  Intolerant of statins in the past. Will advise if values are higher      Relevant Medications   irbesartan-hydrochlorothiazide (AVALIDE) 300-12.5 MG tablet   Other Relevant Orders   Lipid panel   Essential hypertension    Normal exam with stable BP  on avapro and hydrochlorothiazide 12.5 mg. No concerns or side effects to current medication. No change in regimen; continue low sodium diet. Will change Rx to be one combo pill       Relevant Medications   irbesartan-hydrochlorothiazide (AVALIDE) 300-12.5 MG tablet   Other Relevant Orders   CBC with Differential/Platelet   Comprehensive metabolic panel   TSH   POCT urinalysis dipstick   Other Visit Diagnoses     Annual physical exam    -  Primary   up to date on screenings and immunizations DEXA ordered   Initial Medicare annual wellness visit       EKG done earlier this year. all other measures satisfied   Encounter for screening for osteoporosis       Relevant Orders   DG Bone Density   Need for shingles vaccine       Relevant Medications   Zoster Vaccine Adjuvanted [facility]) injection   Rash and nonspecific skin eruption       Relevant Medications   triamcinolone cream (KENALOG) 0.1 %       Return in about 6 months (around 05/22/2023) for HTN.    Reubin Milan, MD [facility] [place]

## 2022-11-19 NOTE — Assessment & Plan Note (Addendum)
Normal exam with stable BP on avapro and hydrochlorothiazide 12.5 mg. No concerns or side effects to current medication. No change in regimen; continue low sodium diet. Will change Rx to be one combo pill

## 2022-11-19 NOTE — Patient Instructions (Signed)
Call [facility] to schedule your bone density test at [phone].

## 2022-11-19 NOTE — Assessment & Plan Note (Signed)
Managed with diet alone Statin recommended but pt prefers diet changes for now.  Intolerant of statins in the past. Will advise if values are higher

## 2022-11-20 LAB — CBC WITH DIFFERENTIAL/PLATELET
Basophils Absolute: 0 10*3/uL (ref 0.0–0.2)
Basos: 0 %
EOS (ABSOLUTE): 0.2 10*3/uL (ref 0.0–0.4)
Eos: 3 %
Hematocrit: 39.2 % (ref 34.0–46.6)
Hemoglobin: 12.7 g/dL (ref 11.1–15.9)
Immature Grans (Abs): 0 10*3/uL (ref 0.0–0.1)
Immature Granulocytes: 0 %
Lymphocytes Absolute: 2.7 10*3/uL (ref 0.7–3.1)
Lymphs: 29 %
MCH: 29.3 pg (ref 26.6–33.0)
MCHC: 32.4 g/dL (ref 31.5–35.7)
MCV: 90 fL (ref 79–97)
Monocytes Absolute: 0.7 10*3/uL (ref 0.1–0.9)
Monocytes: 8 %
Neutrophils Absolute: 5.5 10*3/uL (ref 1.4–7.0)
Neutrophils: 60 %
Platelets: 235 10*3/uL (ref 150–450)
RBC: 4.34 x10E6/uL (ref 3.77–5.28)
RDW: 12.8 % (ref 11.7–15.4)
WBC: 9.1 10*3/uL (ref 3.4–10.8)

## 2022-11-20 LAB — COMPREHENSIVE METABOLIC PANEL
ALT: 12 IU/L (ref 0–32)
AST: 20 IU/L (ref 0–40)
Albumin: 3.7 g/dL — ABNORMAL LOW (ref 3.9–4.9)
Alkaline Phosphatase: 84 IU/L (ref 44–121)
BUN/Creatinine Ratio: 17 (ref 12–28)
BUN: 15 mg/dL (ref 8–27)
Bilirubin Total: 0.2 mg/dL (ref 0.0–1.2)
CO2: 21 mmol/L (ref 20–29)
Calcium: 9.8 mg/dL (ref 8.7–10.3)
Chloride: 103 mmol/L (ref 96–106)
Creatinine, Ser: 0.86 mg/dL (ref 0.57–1.00)
Globulin, Total: 3.3 g/dL (ref 1.5–4.5)
Glucose: 82 mg/dL (ref 70–99)
Potassium: 4.7 mmol/L (ref 3.5–5.2)
Sodium: 139 mmol/L (ref 134–144)
Total Protein: 7 g/dL (ref 6.0–8.5)
eGFR: 75 mL/min/{1.73_m2} (ref >59)

## 2022-11-20 LAB — LIPID PANEL
Chol/HDL Ratio: 4.9 ratio — ABNORMAL HIGH (ref 0.0–4.4)
Cholesterol, Total: 196 mg/dL (ref 100–199)
HDL: 40 mg/dL (ref >39)
LDL Chol Calc (NIH): 145 mg/dL — ABNORMAL HIGH (ref 0–99)
Triglycerides: 62 mg/dL (ref 0–149)
VLDL Cholesterol Cal: 11 mg/dL (ref 5–40)

## 2022-11-20 LAB — TSH: TSH: 0.668 u[IU]/mL (ref 0.450–4.500)

## 2022-11-21 ENCOUNTER — Other Ambulatory Visit: Payer: Self-pay | Admitting: Internal Medicine

## 2022-11-21 DIAGNOSIS — I10 Essential (primary) hypertension: Secondary | ICD-10-CM

## 2022-11-23 NOTE — Telephone Encounter (Signed)
Refilled 11/17/22 # 100. Requested Prescriptions  Refused Prescriptions Disp Refills   irbesartan (AVAPRO) 300 MG tablet [Pharmacy Med Name: IRBESARTAN 300 MG TABLET] 100 tablet 0    Sig: TAKE 1 TABLET BY MOUTH EVERY DAY     Cardiovascular:  Angiotensin Receptor Blockers Passed - 11/21/2022  8:48 AM      Passed - Cr in normal range and within 180 days    Creatinine, Ser  Date Value Ref Range Status  11/19/2022 0.86 0.57 - 1.00 mg/dL Final         Passed - K in normal range and within 180 days    Potassium  Date Value Ref Range Status  11/19/2022 4.7 3.5 - 5.2 mmol/L Final         Passed - Patient is not pregnant      Passed - Last BP in normal range    BP Readings from Last 1 Encounters:  11/19/22 129/66         Passed - Valid encounter within last 6 months    Recent Outpatient Visits           4 days ago Annual physical exam   [place] [facility] at [facility], Nyoka Cowden, MD   1 month ago Essential hypertension   [place] [facility] at [facility], Nyoka Cowden, MD   4 months ago Essential hypertension   [place] [facility] at [facility], Nyoka Cowden, MD   6 months ago Essential hypertension   [place] [facility] at [facility], Nyoka Cowden, MD   7 months ago Essential hypertension   [facility] at [facility], Nyoka Cowden, MD       Future Appointments             In 5 months Judithann Graves, Nyoka Cowden, MD [facility] Primary Care & Sports Medicine at [facility], [facility]   In 1 year Judithann Graves, Nyoka Cowden, MD [facility] Primary Care & Sports Medicine at [facility], [facility]

## 2023-02-01 ENCOUNTER — Telehealth: Payer: Self-pay | Admitting: Pharmacist

## 2023-02-01 NOTE — Progress Notes (Signed)
   02/01/2023  Patient ID: Shannon Barton, female   DOB: 11/20/1955, 67 y.o.   MRN: [id number]  Patient is at risk for failing the adherence metric for BP medications. Last picked up medications on 08/27/22 for 90 days supply. Should have ran out by 11/27/22 (2 months ago). PCP sent in a combination pill to assist with adherence, but patient has yet to pick this up from the pharmacy.  Unable to reach and leave a voicemail on either phone number listed.    Marlowe Aschoff, PharmD [facility] Phone Number: [phone]

## 2023-05-13 ENCOUNTER — Ambulatory Visit: Payer: Self-pay | Admitting: Internal Medicine

## 2023-08-15 ENCOUNTER — Other Ambulatory Visit: Payer: Self-pay

## 2023-08-15 DIAGNOSIS — I10 Essential (primary) hypertension: Secondary | ICD-10-CM

## 2023-08-15 MED ORDER — IRBESARTAN-HYDROCHLOROTHIAZIDE 300-12.5 MG PO TABS: 1.0000 | ORAL_TABLET | Freq: Every day | ORAL | 1 refills | Status: DC

## 2023-08-22 ENCOUNTER — Other Ambulatory Visit: Payer: Self-pay | Admitting: Internal Medicine

## 2023-08-22 DIAGNOSIS — I10 Essential (primary) hypertension: Secondary | ICD-10-CM

## 2023-08-22 NOTE — Telephone Encounter (Signed)
 Copied from CRM [id number]. Topic: Clinical - Medication Refill >> Aug 22, 2023 10:11 AM Carlatta H wrote: Medication:  hydrochlorothiazide  (HYDRODIURIL ) 25 MG tablet [[id number]] Has the patient contacted their pharmacy? No (Agent: If no, request that the patient contact the pharmacy for the refill. If patient does not wish to contact the pharmacy document the reason why and proceed with request.) (Agent: If yes, when and what did the pharmacy advise?)  This is the patient's preferred pharmacy:  [id number] - [place], [place] - [address] [place] [address] Phone: [phone] Fax: [phone]  Is this the correct pharmacy for this prescription? Yes If no, delete pharmacy and type the correct one.   Has the prescription been filled recently? No  Is the patient out of the medication? Yes  Has the patient been seen for an appointment in the last year OR does the patient have an upcoming appointment? Yes  Can we respond through MyChart? No  Agent: Please be advised that Rx refills may take up to 3 business days. We ask that you follow-up with your pharmacy.

## 2023-08-24 MED ORDER — HYDROCHLOROTHIAZIDE 25 MG PO TABS: 25.0000 mg | ORAL_TABLET | Freq: Every day | ORAL | 0 refills | Status: DC

## 2023-08-24 NOTE — Telephone Encounter (Signed)
 Requested medication (s) are due for refill today:   Not sure  Requested medication (s) are on the active medication list:   Yes  Future visit scheduled:   Yes 8/22.   LOV 11/19/2022.   Had appt 05/13/2023 No Show.    Last ordered: 07/23/2022 #90, 0 refills  Per OV from 11/19/2022 she was put on a single pill of Avalide 300-12.5 mg so she would not have to take 2 pills for BP.   Not sure she is still on this.   Provider to review.      Requested Prescriptions  Pending Prescriptions Disp Refills   hydrochlorothiazide  (HYDRODIURIL ) 25 MG tablet 90 tablet 0    Sig: Take 1 tablet (25 mg total) by mouth daily.     Cardiovascular: Diuretics - Thiazide Failed - 08/24/2023 10:28 AM      Failed - Cr in normal range and within 180 days    Creatinine, Ser  Date Value Ref Range Status  11/19/2022 0.86 0.57 - 1.00 mg/dL Final         Failed - K in normal range and within 180 days    Potassium  Date Value Ref Range Status  11/19/2022 4.7 3.5 - 5.2 mmol/L Final         Failed - Na in normal range and within 180 days    Sodium  Date Value Ref Range Status  11/19/2022 139 134 - 144 mmol/L Final         Failed - Valid encounter within last 6 months    Recent Outpatient Visits   None     Future Appointments             In 3 months Gala Jubilee Chales [place], MD [facility] Primary Care & Sports Medicine at [facility], [facility]            Passed - Last BP in normal range    BP Readings from Last 1 Encounters:  11/19/22 129/66

## 2023-08-24 NOTE — Telephone Encounter (Signed)
 Medication refill

## 2023-08-30 ENCOUNTER — Other Ambulatory Visit: Payer: Self-pay | Admitting: Internal Medicine

## 2023-08-30 DIAGNOSIS — I10 Essential (primary) hypertension: Secondary | ICD-10-CM

## 2023-08-30 NOTE — Telephone Encounter (Signed)
 Copied from CRM [id number]. Topic: Clinical - Medication Refill >> Aug 30, 2023 10:54 AM Cynthia K wrote: Medication: irbesartan -hydrochlorothiazide  (AVALIDE) 300-12.5 MG tablet   Has the patient contacted their pharmacy? Yes (Agent: If no, request that the patient contact the pharmacy for the refill. If patient does not wish to contact the pharmacy document the reason why and proceed with request.) (Agent: If yes, when and what did the pharmacy advise?) Pharmacy needs order to refill  This is the patient's preferred pharmacy:  [id number] - [place], [place] - [address] [place] [address] Phone: [phone] Fax: [phone]  Is this the correct pharmacy for this prescription? Yes If no, delete pharmacy and type the correct one.   Has the prescription been filled recently? No  Is the patient out of the medication? No  Has the patient been seen for an appointment in the last year OR does the patient have an upcoming appointment? Yes  Can we respond through MyChart? No  Agent: Please be advised that Rx refills may take up to 3 business days. We ask that you follow-up with your pharmacy.

## 2023-09-01 ENCOUNTER — Telehealth: Payer: Self-pay | Admitting: Internal Medicine

## 2023-09-01 NOTE — Telephone Encounter (Signed)
 Copied from CRM [id number]. Topic: Medicare AWV >> Sep 01, 2023 11:29 AM Juliana Ocean wrote: Reason for CRM: Called 09/01/2023 to sched AWV - MAILBOX FULL  Rosalee Collins; Care Guide Ambulatory Clinical Support [place] [facility] Direct Dial: [phone]

## 2023-09-01 NOTE — Telephone Encounter (Signed)
 Refilled 08/15/2023 #90 1 rf at same pharmacy. Requested Prescriptions  Pending Prescriptions Disp Refills   irbesartan -hydrochlorothiazide  (AVALIDE) 300-12.5 MG tablet 90 tablet 1    Sig: Take 1 tablet by mouth daily.     Cardiovascular: ARB + Diuretic Combos Failed - 09/01/2023  3:02 PM      Failed - K in normal range and within 180 days    Potassium  Date Value Ref Range Status  11/19/2022 4.7 3.5 - 5.2 mmol/L Final         Failed - Na in normal range and within 180 days    Sodium  Date Value Ref Range Status  11/19/2022 139 134 - 144 mmol/L Final         Failed - Cr in normal range and within 180 days    Creatinine, Ser  Date Value Ref Range Status  11/19/2022 0.86 0.57 - 1.00 mg/dL Final         Failed - eGFR is 10 or above and within 180 days    GFR calc Af Amer  Date Value Ref Range Status  06/17/2016 108 >59 mL/min/1.73 Final   GFR calc non Af Amer  Date Value Ref Range Status  06/17/2016 94 >59 mL/min/1.73 Final   eGFR  Date Value Ref Range Status  11/19/2022 75 >59 mL/min/1.73 Final         Failed - Valid encounter within last 6 months    Recent Outpatient Visits   None     Future Appointments             In 2 months Sheron Dixons, MD [facility] Primary Care & Sports Medicine at [facility], [facility]            Passed - Patient is not pregnant      Passed - Last BP in normal range    BP Readings from Last 1 Encounters:  11/19/22 129/66

## 2023-11-25 ENCOUNTER — Encounter: Payer: Self-pay | Admitting: Internal Medicine

## 2023-11-25 ENCOUNTER — Ambulatory Visit (INDEPENDENT_AMBULATORY_CARE_PROVIDER_SITE_OTHER): Payer: Self-pay | Admitting: Internal Medicine

## 2023-11-25 VITALS — BP 122/74 | HR 60 | Ht 64.0 in | Wt 157.0 lb

## 2023-11-25 DIAGNOSIS — E782 Mixed hyperlipidemia: Secondary | ICD-10-CM | POA: Diagnosis not present

## 2023-11-25 DIAGNOSIS — I493 Ventricular premature depolarization: Secondary | ICD-10-CM

## 2023-11-25 DIAGNOSIS — Z0000 Encounter for general adult medical examination without abnormal findings: Secondary | ICD-10-CM

## 2023-11-25 DIAGNOSIS — Z1231 Encounter for screening mammogram for malignant neoplasm of breast: Secondary | ICD-10-CM

## 2023-11-25 DIAGNOSIS — I10 Essential (primary) hypertension: Secondary | ICD-10-CM

## 2023-11-25 DIAGNOSIS — Z13820 Encounter for screening for osteoporosis: Secondary | ICD-10-CM | POA: Diagnosis not present

## 2023-11-25 DIAGNOSIS — I499 Cardiac arrhythmia, unspecified: Secondary | ICD-10-CM | POA: Diagnosis not present

## 2023-11-25 DIAGNOSIS — F17200 Nicotine dependence, unspecified, uncomplicated: Secondary | ICD-10-CM

## 2023-11-25 MED ORDER — IRBESARTAN-HYDROCHLOROTHIAZIDE 300-12.5 MG PO TABS: 1.0000 | ORAL_TABLET | Freq: Every day | ORAL | 1 refills | Status: DC

## 2023-11-25 NOTE — Assessment & Plan Note (Addendum)
 Qualifies for LDCT screening. She will consider. She continues to smoke but is planning to try to quit in the near future.

## 2023-11-25 NOTE — Progress Notes (Signed)
 Date:  11/25/2023   Name:  Shannon Barton   DOB:  1955-08-16   MRN:  [id number]   Chief Complaint: Annual Exam Shannon Barton is a 68 y.o. female who presents today for her Complete Annual Exam. She feels well. She reports exercising/ walking. She reports she is sleeping well. Breast complaints - none.  Health Maintenance  Topic Date Due   DTaP/Tdap/Td vaccine (1 - Tdap) Never done   Screening for Lung Cancer  Never done   Zoster (Shingles) Vaccine (1 of 2) Never done   DEXA scan (bone density measurement)  Never done   COVID-19 Vaccine (3 - 2024-25 season) 12/05/2022   Mammogram  07/30/2023   Medicare Annual Wellness Visit  11/19/2023   Flu Shot  07/03/2024*   Colon Cancer Screening  05/03/2032   Pneumococcal Vaccine for age over 90  Completed   Hepatitis C Screening  Addressed   HPV Vaccine  Aged Out   Meningitis B Vaccine  Aged Out  *Topic was postponed. The date shown is not the original due date.     Hypertension This is a chronic problem. The problem is controlled. Pertinent negatives include no chest pain, headaches, palpitations or shortness of breath. Past treatments include angiotensin blockers and diuretics. There is no history of kidney disease, CAD/MI or CVA.    Review of Systems  Constitutional:  Negative for fatigue and unexpected weight change.  HENT:  Negative for trouble swallowing.   Eyes:  Negative for visual disturbance.  Respiratory:  Negative for cough, chest tightness, shortness of breath and wheezing.   Cardiovascular:  Negative for chest pain, palpitations and leg swelling.  Gastrointestinal:  Negative for abdominal pain, constipation and diarrhea.  Musculoskeletal:  Negative for arthralgias and myalgias.  Neurological:  Negative for dizziness, weakness, light-headedness and headaches.     Lab Results  Component Value Date   NA 139 11/19/2022   K 4.7 11/19/2022   CO2 21 11/19/2022   GLUCOSE 82 11/19/2022   BUN 15 11/19/2022    CREATININE 0.86 11/19/2022   CALCIUM  9.8 11/19/2022   EGFR 75 11/19/2022   GFRNONAA 94 06/17/2016   Lab Results  Component Value Date   CHOL 196 11/19/2022   HDL 40 11/19/2022   LDLCALC 145 (H) 11/19/2022   TRIG 62 11/19/2022   CHOLHDL 4.9 (H) 11/19/2022   Lab Results  Component Value Date   TSH 0.668 11/19/2022   No results found for: HGBA1C Lab Results  Component Value Date   WBC 9.1 11/19/2022   HGB 12.7 11/19/2022   HCT 39.2 11/19/2022   MCV 90 11/19/2022   PLT 235 11/19/2022   Lab Results  Component Value Date   ALT 12 11/19/2022   AST 20 11/19/2022   ALKPHOS 84 11/19/2022   BILITOT 0.2 11/19/2022   No results found for: MARIEN BOLLS, VD25OH   Patient Active Problem List   Diagnosis Date Noted   Polyp of sigmoid colon 05/03/2022   Essential hypertension 04/09/2022   Mixed hyperlipidemia 11/10/2016   Tobacco use disorder 06/13/2016   BMI 30.0-30.9,adult 06/13/2016    Allergies  Allergen Reactions   Atorvastatin  Other (See Comments)    Abdominal pain    Past Surgical History:  Procedure Laterality Date   COLONOSCOPY WITH PROPOFOL  N/A 05/03/2022   Procedure: COLONOSCOPY WITH PROPOFOL  with polypectomy;  Surgeon: Jinny Carmine, MD;  Location: [facility];  Service: Endoscopy;  Laterality: N/A;   TONSILLECTOMY  1963    Social History  Tobacco Use   Smoking status: Every Day    Current packs/day: 1.00    Average packs/day: 1 pack/day for 41.6 years (41.6 ttl pk-yrs)    Types: Cigarettes    Start date: 1984   Smokeless tobacco: Never  Vaping Use   Vaping status: Never Used  Substance Use Topics   Alcohol use: No   Drug use: No     Medication list has been reviewed and updated.  Current Meds  Medication Sig   [DISCONTINUED] hydrochlorothiazide  (HYDRODIURIL ) 25 MG tablet Take 1 tablet (25 mg total) by mouth daily.   [DISCONTINUED] irbesartan  (AVAPRO ) 300 MG tablet Take 1 tablet (300 mg total) by mouth daily.    [DISCONTINUED] triamcinolone  cream (KENALOG ) 0.1 % Apply 1 Application topically 2 (two) times daily. To rash on leg       11/25/2023    7:53 AM 10/15/2022    2:44 PM 07/23/2022    1:28 PM 05/21/2022    2:28 PM  GAD 7 : Generalized Anxiety Score  Nervous, Anxious, on Edge 0 0 0 0  Control/stop worrying 0 0 0 0  Worry too much - different things 0 0 0 0  Trouble relaxing 0 0 0 0  Restless 0 0 0 0  Easily annoyed or irritable 0 0 0 0  Afraid - awful might happen 0 0 0 0  Total GAD 7 Score 0 0 0 0  Anxiety Difficulty Not difficult at all Not difficult at all Not difficult at all Not difficult at all       11/25/2023    7:53 AM 11/19/2022   10:16 AM 10/15/2022    2:43 PM  Depression screen PHQ 2/9  Decreased Interest 0 0 0  Down, Depressed, Hopeless 0 0 0  PHQ - 2 Score 0 0 0  Altered sleeping 0 0 0  Tired, decreased energy 0 0 0  Change in appetite 0 0 0  Feeling bad or failure about yourself  0 0 0  Trouble concentrating 0 0 0  Moving slowly or fidgety/restless  0 0  Suicidal thoughts 0 0 0  PHQ-9 Score 0 0 0  Difficult doing work/chores Not difficult at all Not difficult at all Not difficult at all    BP Readings from Last 3 Encounters:  11/25/23 122/74  11/19/22 129/66  10/15/22 102/70    Physical Exam Vitals and nursing note reviewed.  Constitutional:      General: She is not in acute distress.    Appearance: She is well-developed.  HENT:     Head: Normocephalic and atraumatic.     Right Ear: Tympanic membrane and ear canal normal.     Left Ear: Tympanic membrane and ear canal normal.     Nose:     Right Sinus: No maxillary sinus tenderness.     Left Sinus: No maxillary sinus tenderness.  Eyes:     General: No scleral icterus.       Right eye: No discharge.        Left eye: No discharge.     Conjunctiva/sclera: Conjunctivae normal.  Neck:     Thyroid : No thyromegaly.     Vascular: No carotid bruit.  Cardiovascular:     Rate and Rhythm: Normal rate. Rhythm  irregular.     Pulses: Normal pulses.     Heart sounds: Normal heart sounds. No murmur heard. Pulmonary:     Effort: Pulmonary effort is normal. No respiratory distress.     Breath sounds: No  wheezing.  Abdominal:     General: Bowel sounds are normal.     Palpations: Abdomen is soft.     Tenderness: There is no abdominal tenderness.  Musculoskeletal:     Cervical back: Normal range of motion. No erythema.     Right lower leg: No edema.     Left lower leg: No edema.  Lymphadenopathy:     Cervical: No cervical adenopathy.  Skin:    General: Skin is warm and dry.     Findings: No rash.  Neurological:     Mental Status: She is alert and oriented to person, place, and time.     Cranial Nerves: No cranial nerve deficit.     Sensory: No sensory deficit.     Deep Tendon Reflexes: Reflexes are normal and symmetric.  Psychiatric:        Attention and Perception: Attention normal.        Mood and Affect: Mood normal.     Wt Readings from Last 3 Encounters:  11/25/23 157 lb (71.2 kg)  11/19/22 166 lb (75.3 kg)  10/15/22 166 lb (75.3 kg)    BP 122/74   Pulse 60   Ht 5' 4 (1.626 m)   Wt 157 lb (71.2 kg)   SpO2 98%   BMI 26.95 kg/m   Assessment and Plan:  Problem List Items Addressed This Visit       Unprioritized   Tobacco use disorder   Qualifies for LDCT screening. She will consider. She continues to smoke but is planning to try to quit in the near future.      Relevant Orders   Ambulatory Referral Lung Cancer Screening [facility]   Mixed hyperlipidemia (Chronic)   Managed with diet alone due to statin intolerance. Lab Results  Component Value Date   LDLCALC 145 (H) 11/19/2022         Relevant Medications   irbesartan -hydrochlorothiazide  (AVALIDE) 300-12.5 MG tablet   Other Relevant Orders   Lipid panel   Essential hypertension   Blood pressure is well controlled on irbesartan  and hydrochlorothiazide . No medication side effects noted. Plan to  continue current medications.       Relevant Medications   irbesartan -hydrochlorothiazide  (AVALIDE) 300-12.5 MG tablet   Other Relevant Orders   CBC with Differential/Platelet   Comprehensive metabolic panel with GFR   TSH   Urinalysis, Routine w reflex microscopic   Other Visit Diagnoses       Annual physical exam    -  Primary     Encounter for screening mammogram for breast cancer       Relevant Orders   MM 3D SCREENING MAMMOGRAM BILATERAL BREAST     Encounter for screening for osteoporosis       Relevant Orders   DG Bone Density     Asymptomatic PVCs       SB and PVC on EKG pt to monitor for symptoms and call if needed   Relevant Medications   irbesartan -hydrochlorothiazide  (AVALIDE) 300-12.5 MG tablet   Other Relevant Orders   Comprehensive metabolic panel with GFR   TSH   EKG 12-Lead (Completed)       No follow-ups on file.    Leita HILARIO Adie, MD [facility] [place]

## 2023-11-25 NOTE — Assessment & Plan Note (Signed)
 Managed with diet alone due to statin intolerance. Lab Results  Component Value Date   LDLCALC 145 (H) 11/19/2022

## 2023-11-25 NOTE — Patient Instructions (Addendum)
 Call [facility] to schedule your mammogram and bone density test at [phone].   Some one from [facility] will call you about the lung cancer screening CT scan.

## 2023-11-25 NOTE — Assessment & Plan Note (Signed)
 Blood pressure is well controlled on irbesartan  and hydrochlorothiazide . No medication side effects noted. Plan to continue current medications.

## 2023-11-26 ENCOUNTER — Ambulatory Visit: Payer: Self-pay | Admitting: Internal Medicine

## 2023-11-26 LAB — COMPREHENSIVE METABOLIC PANEL WITH GFR
ALT: 20 IU/L (ref 0–32)
AST: 25 IU/L (ref 0–40)
Albumin: 3.8 g/dL — ABNORMAL LOW (ref 3.9–4.9)
Alkaline Phosphatase: 78 IU/L (ref 44–121)
BUN/Creatinine Ratio: 14 (ref 12–28)
BUN: 12 mg/dL (ref 8–27)
Bilirubin Total: 0.4 mg/dL (ref 0.0–1.2)
CO2: 23 mmol/L (ref 20–29)
Calcium: 9 mg/dL (ref 8.7–10.3)
Chloride: 103 mmol/L (ref 96–106)
Creatinine, Ser: 0.88 mg/dL (ref 0.57–1.00)
Globulin, Total: 3.1 g/dL (ref 1.5–4.5)
Glucose: 102 mg/dL — ABNORMAL HIGH (ref 70–99)
Potassium: 4.7 mmol/L (ref 3.5–5.2)
Sodium: 139 mmol/L (ref 134–144)
Total Protein: 6.9 g/dL (ref 6.0–8.5)
eGFR: 72 mL/min/1.73 (ref >59)

## 2023-11-26 LAB — CBC WITH DIFFERENTIAL/PLATELET
Basophils Absolute: 0 x10E3/uL (ref 0.0–0.2)
Basos: 1 %
EOS (ABSOLUTE): 0.1 x10E3/uL (ref 0.0–0.4)
Eos: 2 %
Hematocrit: 43 % (ref 34.0–46.6)
Hemoglobin: 13.6 g/dL (ref 11.1–15.9)
Immature Grans (Abs): 0 x10E3/uL (ref 0.0–0.1)
Immature Granulocytes: 0 %
Lymphocytes Absolute: 2.3 x10E3/uL (ref 0.7–3.1)
Lymphs: 28 %
MCH: 29.6 pg (ref 26.6–33.0)
MCHC: 31.6 g/dL (ref 31.5–35.7)
MCV: 94 fL (ref 79–97)
Monocytes Absolute: 1 x10E3/uL — ABNORMAL HIGH (ref 0.1–0.9)
Monocytes: 12 %
Neutrophils Absolute: 4.8 x10E3/uL (ref 1.4–7.0)
Neutrophils: 57 %
Platelets: 217 x10E3/uL (ref 150–450)
RBC: 4.59 x10E6/uL (ref 3.77–5.28)
RDW: 12.6 % (ref 11.7–15.4)
WBC: 8.3 x10E3/uL (ref 3.4–10.8)

## 2023-11-26 LAB — MICROSCOPIC EXAMINATION
Bacteria, UA: NONE SEEN
Casts: NONE SEEN /LPF
RBC, Urine: NONE SEEN /HPF (ref 0–2)
WBC, UA: NONE SEEN /HPF (ref 0–5)

## 2023-11-26 LAB — URINALYSIS, ROUTINE W REFLEX MICROSCOPIC
Bilirubin, UA: NEGATIVE
Glucose, UA: NEGATIVE
Ketones, UA: NEGATIVE
Leukocytes,UA: NEGATIVE
Nitrite, UA: NEGATIVE
RBC, UA: NEGATIVE
Specific Gravity, UA: 1.016 (ref 1.005–1.030)
Urobilinogen, Ur: 1 mg/dL (ref 0.2–1.0)
pH, UA: 6.5 (ref 5.0–7.5)

## 2023-11-26 LAB — LIPID PANEL
Chol/HDL Ratio: 6.1 ratio — ABNORMAL HIGH (ref 0.0–4.4)
Cholesterol, Total: 215 mg/dL — ABNORMAL HIGH (ref 100–199)
HDL: 35 mg/dL — ABNORMAL LOW (ref >39)
LDL Chol Calc (NIH): 168 mg/dL — ABNORMAL HIGH (ref 0–99)
Triglycerides: 66 mg/dL (ref 0–149)
VLDL Cholesterol Cal: 12 mg/dL (ref 5–40)

## 2023-11-26 LAB — TSH: TSH: 0.648 u[IU]/mL (ref 0.450–4.500)

## 2023-11-29 ENCOUNTER — Other Ambulatory Visit: Payer: Self-pay | Admitting: Internal Medicine

## 2023-11-29 ENCOUNTER — Telehealth: Payer: Self-pay

## 2023-11-29 DIAGNOSIS — E782 Mixed hyperlipidemia: Secondary | ICD-10-CM

## 2023-11-29 MED ORDER — ROSUVASTATIN CALCIUM 5 MG PO TABS: 5.0000 mg | ORAL_TABLET | Freq: Every day | ORAL | 0 refills | Status: DC

## 2023-11-29 NOTE — Progress Notes (Unsigned)
 Date:  11/29/2023   Name:  Shannon Barton   DOB:  23-Oct-1955   MRN:  [id number]   Chief Complaint: No chief complaint on file.  HPI  Review of Systems   Lab Results  Component Value Date   NA 139 11/25/2023   K 4.7 11/25/2023   CO2 23 11/25/2023   GLUCOSE 102 (H) 11/25/2023   BUN 12 11/25/2023   CREATININE 0.88 11/25/2023   CALCIUM  9.0 11/25/2023   EGFR 72 11/25/2023   GFRNONAA 94 06/17/2016   Lab Results  Component Value Date   CHOL 215 (H) 11/25/2023   HDL 35 (L) 11/25/2023   LDLCALC 168 (H) 11/25/2023   TRIG 66 11/25/2023   CHOLHDL 6.1 (H) 11/25/2023   Lab Results  Component Value Date   TSH 0.648 11/25/2023   No results found for: HGBA1C Lab Results  Component Value Date   WBC 8.3 11/25/2023   HGB 13.6 11/25/2023   HCT 43.0 11/25/2023   MCV 94 11/25/2023   PLT 217 11/25/2023   Lab Results  Component Value Date   ALT 20 11/25/2023   AST 25 11/25/2023   ALKPHOS 78 11/25/2023   BILITOT 0.4 11/25/2023   No results found for: MARIEN BOLLS, VD25OH   Patient Active Problem List   Diagnosis Date Noted   Polyp of sigmoid colon 05/03/2022   Essential hypertension 04/09/2022   Mixed hyperlipidemia 11/10/2016   Tobacco use disorder 06/13/2016   BMI 30.0-30.9,adult 06/13/2016    Allergies  Allergen Reactions   Atorvastatin  Other (See Comments)    Abdominal pain    Past Surgical History:  Procedure Laterality Date   COLONOSCOPY WITH PROPOFOL  N/A 05/03/2022   Procedure: COLONOSCOPY WITH PROPOFOL  with polypectomy;  Surgeon: Jinny Carmine, MD;  Location: [facility];  Service: Endoscopy;  Laterality: N/A;   TONSILLECTOMY  1963    Social History   Tobacco Use   Smoking status: Every Day    Current packs/day: 1.00    Average packs/day: 1 pack/day for 41.6 years (41.6 ttl pk-yrs)    Types: Cigarettes    Start date: 1984   Smokeless tobacco: Never  Vaping Use   Vaping status: Never Used  Substance Use Topics   Alcohol  use: No   Drug use: No     Medication list has been reviewed and updated.  No outpatient medications have been marked as taking for the 11/29/23 encounter (Orders Only) with Justus Leita DEL, MD.       11/25/2023    7:53 AM 10/15/2022    2:44 PM 07/23/2022    1:28 PM 05/21/2022    2:28 PM  GAD 7 : Generalized Anxiety Score  Nervous, Anxious, on Edge 0 0 0 0  Control/stop worrying 0 0 0 0  Worry too much - different things 0 0 0 0  Trouble relaxing 0 0 0 0  Restless 0 0 0 0  Easily annoyed or irritable 0 0 0 0  Afraid - awful might happen 0 0 0 0  Total GAD 7 Score 0 0 0 0  Anxiety Difficulty Not difficult at all Not difficult at all Not difficult at all Not difficult at all       11/25/2023    7:53 AM 11/19/2022   10:16 AM 10/15/2022    2:43 PM  Depression screen PHQ 2/9  Decreased Interest 0 0 0  Down, Depressed, Hopeless 0 0 0  PHQ - 2 Score 0 0 0  Altered sleeping  0 0 0  Tired, decreased energy 0 0 0  Change in appetite 0 0 0  Feeling bad or failure about yourself  0 0 0  Trouble concentrating 0 0 0  Moving slowly or fidgety/restless  0 0  Suicidal thoughts 0 0 0  PHQ-9 Score 0 0 0  Difficult doing work/chores Not difficult at all Not difficult at all Not difficult at all    BP Readings from Last 3 Encounters:  11/25/23 122/74  11/19/22 129/66  10/15/22 102/70    Physical Exam  Wt Readings from Last 3 Encounters:  11/25/23 157 lb (71.2 kg)  11/19/22 166 lb (75.3 kg)  10/15/22 166 lb (75.3 kg)    There were no vitals taken for this visit.  Assessment and Plan:  Problem List Items Addressed This Visit   None   No follow-ups on file.    Leita HILARIO Adie, MD [facility] [place]

## 2023-11-29 NOTE — Telephone Encounter (Signed)
 Called pt left VM to call back.  KP

## 2023-11-29 NOTE — Telephone Encounter (Signed)
 Copied from CRM [id number]. Topic: Clinical - Medical Advice >> Nov 29, 2023 11:17 AM Nathanel BROCKS wrote: Reason for CRM: Pt called and I gave her, her lab results and advised that the dr requested that she goes on Crestor  5 mg and the patient stated that she would. To please call into drug store to have it filled.   [id number] - [place], [place] - [address]  Phone: [phone] Fax: [phone]

## 2023-11-30 NOTE — Telephone Encounter (Signed)
 Pt called back and spoke to E2C2.  KP

## 2023-12-01 ENCOUNTER — Other Ambulatory Visit: Payer: Self-pay | Admitting: Internal Medicine

## 2023-12-01 DIAGNOSIS — I10 Essential (primary) hypertension: Secondary | ICD-10-CM

## 2023-12-24 ENCOUNTER — Other Ambulatory Visit: Payer: Self-pay | Admitting: Internal Medicine

## 2023-12-24 DIAGNOSIS — E782 Mixed hyperlipidemia: Secondary | ICD-10-CM

## 2023-12-26 NOTE — Telephone Encounter (Signed)
 Requested Prescriptions  Pending Prescriptions Disp Refills   rosuvastatin  (CRESTOR ) 5 MG tablet [Pharmacy Med Name: ROSUVASTATIN  CALCIUM  5 MG TAB] 90 tablet 3    Sig: TAKE 1 TABLET (5 MG TOTAL) BY MOUTH DAILY.     Cardiovascular:  Antilipid - Statins 2 Failed - 12/26/2023  2:44 PM      Failed - Lipid Panel in normal range within the last 12 months    Cholesterol, Total  Date Value Ref Range Status  11/25/2023 215 (H) 100 - 199 mg/dL Final   LDL Chol Calc (NIH)  Date Value Ref Range Status  11/25/2023 168 (H) 0 - 99 mg/dL Final   HDL  Date Value Ref Range Status  11/25/2023 35 (L) >39 mg/dL Final   Triglycerides  Date Value Ref Range Status  11/25/2023 66 0 - 149 mg/dL Final         Passed - Cr in normal range and within 360 days    Creatinine, Ser  Date Value Ref Range Status  11/25/2023 0.88 0.57 - 1.00 mg/dL Final         Passed - Patient is not pregnant      Passed - Valid encounter within last 12 months    Recent Outpatient Visits           1 month ago Annual physical exam   [facility] at [facility], Leita DEL, MD

## 2024-02-28 ENCOUNTER — Encounter: Payer: Self-pay | Admitting: Student

## 2024-02-28 ENCOUNTER — Ambulatory Visit (INDEPENDENT_AMBULATORY_CARE_PROVIDER_SITE_OTHER): Admitting: Student

## 2024-02-28 VITALS — BP 122/68 | HR 74 | Ht 64.0 in | Wt 156.0 lb

## 2024-02-28 DIAGNOSIS — E782 Mixed hyperlipidemia: Secondary | ICD-10-CM | POA: Diagnosis not present

## 2024-02-28 DIAGNOSIS — I10 Essential (primary) hypertension: Secondary | ICD-10-CM

## 2024-02-28 DIAGNOSIS — F17200 Nicotine dependence, unspecified, uncomplicated: Secondary | ICD-10-CM

## 2024-02-28 DIAGNOSIS — Z23 Encounter for immunization: Secondary | ICD-10-CM

## 2024-02-28 MED ORDER — IRBESARTAN-HYDROCHLOROTHIAZIDE 300-12.5 MG PO TABS: 1.0000 | ORAL_TABLET | Freq: Every day | ORAL | 3 refills | Status: AC

## 2024-02-28 NOTE — Assessment & Plan Note (Addendum)
 Now retired and smoking 1 ppd, previously was smoking 1/2 a ppd. Is contemplative about cessation. Discussed medical management which she which she will consider. She will try to decrease tobacco use in the meantime.

## 2024-02-28 NOTE — Assessment & Plan Note (Addendum)
 The 10-year ASCVD risk score (Arnett DK, et al., 2019) is: 18.9% Tolerating crestor  5 mg daily. Continue current medication.

## 2024-02-28 NOTE — Assessment & Plan Note (Signed)
 Normotensive on irbesartan -hydrochlorothiazide . Denies any side effects. Continue current medications.

## 2024-02-28 NOTE — Progress Notes (Signed)
 Established Patient Office Visit  Subjective   Patient ID: Shannon Barton, female    DOB: June 22, 1955  Age: 68 y.o. MRN: [id number]  Chief Complaint  Patient presents with   Hyperlipidemia   Hypertension    Shannon Barton is a 68 y.o. person with medical hx listed below who presents today for transfer of care. Feeling well today, no acute complaints.   Patient Active Problem List   Diagnosis Date Noted   Polyp of sigmoid colon 05/03/2022   Essential hypertension 04/09/2022   Mixed hyperlipidemia 11/10/2016   Tobacco use disorder 06/13/2016      ROS Refer to HPI    Objective:     Outpatient Encounter Medications as of 02/28/2024  Medication Sig   rosuvastatin  (CRESTOR ) 5 MG tablet TAKE 1 TABLET (5 MG TOTAL) BY MOUTH DAILY.   [DISCONTINUED] irbesartan -hydrochlorothiazide  (AVALIDE) 300-12.5 MG tablet Take 1 tablet by mouth daily.   irbesartan -hydrochlorothiazide  (AVALIDE) 300-12.5 MG tablet Take 1 tablet by mouth daily.   No facility-administered encounter medications on file as of 02/28/2024.    BP 122/68   Pulse 74   Ht 5' 4 (1.626 m)   Wt 156 lb (70.8 kg)   SpO2 97%   BMI 26.78 kg/m  BP Readings from Last 3 Encounters:  02/28/24 122/68  11/25/23 122/74  11/19/22 129/66    Physical Exam Constitutional:      Appearance: Normal appearance.  HENT:     Head: Normocephalic and atraumatic.  Cardiovascular:     Rate and Rhythm: Normal rate and regular rhythm.     Pulses: Normal pulses.     Heart sounds: No murmur heard. Pulmonary:     Effort: Pulmonary effort is normal.     Breath sounds: No rhonchi or rales.  Abdominal:     General: Abdomen is flat. Bowel sounds are normal. There is no distension.     Palpations: Abdomen is soft.     Tenderness: There is no abdominal tenderness.  Musculoskeletal:        General: Normal range of motion.     Right lower leg: No edema.     Left lower leg: No edema.  Skin:    General: Skin is warm and dry.      Capillary Refill: Capillary refill takes less than 2 seconds.  Neurological:     General: No focal deficit present.     Mental Status: She is alert and oriented to person, place, and time.  Psychiatric:        Mood and Affect: Mood normal.        Behavior: Behavior normal.        02/28/2024    7:53 AM 11/25/2023    7:53 AM 11/19/2022   10:16 AM  Depression screen PHQ 2/9  Decreased Interest 0 0 0  Down, Depressed, Hopeless 0 0 0  PHQ - 2 Score 0 0 0  Altered sleeping  0 0  Tired, decreased energy  0 0  Change in appetite  0 0  Feeling bad or failure about yourself   0 0  Trouble concentrating  0 0  Moving slowly or fidgety/restless   0  Suicidal thoughts  0 0  PHQ-9 Score  0  0   Difficult doing work/chores  Not difficult at all Not difficult at all     Data saved with a previous flowsheet row definition       02/28/2024    7:53 AM 11/25/2023    7:53 AM  10/15/2022    2:44 PM 07/23/2022    1:28 PM  GAD 7 : Generalized Anxiety Score  Nervous, Anxious, on Edge 0 0 0 0  Control/stop worrying 0 0 0 0  Worry too much - different things  0 0 0  Trouble relaxing  0 0 0  Restless  0 0 0  Easily annoyed or irritable  0 0 0  Afraid - awful might happen  0 0 0  Total GAD 7 Score  0 0 0  Anxiety Difficulty  Not difficult at all Not difficult at all Not difficult at all    No results found for any visits on 02/28/24.  Last CBC Lab Results  Component Value Date   WBC 8.3 11/25/2023   HGB 13.6 11/25/2023   HCT 43.0 11/25/2023   MCV 94 11/25/2023   MCH 29.6 11/25/2023   RDW 12.6 11/25/2023   PLT 217 11/25/2023   Last metabolic panel Lab Results  Component Value Date   GLUCOSE 102 (H) 11/25/2023   NA 139 11/25/2023   K 4.7 11/25/2023   CL 103 11/25/2023   CO2 23 11/25/2023   BUN 12 11/25/2023   CREATININE 0.88 11/25/2023   EGFR 72 11/25/2023   CALCIUM  9.0 11/25/2023   PROT 6.9 11/25/2023   ALBUMIN 3.8 (L) 11/25/2023   LABGLOB 3.1 11/25/2023   AGRATIO 1.3  05/21/2022   BILITOT 0.4 11/25/2023   ALKPHOS 78 11/25/2023   AST 25 11/25/2023   ALT 20 11/25/2023   Last lipids Lab Results  Component Value Date   CHOL 215 (H) 11/25/2023   HDL 35 (L) 11/25/2023   LDLCALC 168 (H) 11/25/2023   TRIG 66 11/25/2023   CHOLHDL 6.1 (H) 11/25/2023    The 10-year ASCVD risk score (Arnett DK, et al., 2019) is: 18.9%    Assessment & Plan:  Essential hypertension Assessment & Plan: Normotensive on irbesartan -hydrochlorothiazide . Denies any side effects. Continue current medications.   Orders: -     Irbesartan -hydroCHLOROthiazide ; Take 1 tablet by mouth daily.  Dispense: 90 tablet; Refill: 3  Mixed hyperlipidemia Assessment & Plan: The 10-year ASCVD risk score (Arnett DK, et al., 2019) is: 18.9% Tolerating crestor  5 mg daily. Continue current medication.    Encounter for immunization -     Flu vaccine HIGH DOSE PF(Fluzone Trivalent)  Tobacco use disorder Assessment & Plan: Now retired and smoking 1 ppd, previously was smoking 1/2 a ppd. Is contemplative about cessation. Discussed medical management which she which she will consider. She will try to decrease tobacco use in the meantime.       Return in about 4 months (around 06/27/2024) for HTN.    Harlene Saddler, MD

## 2024-06-01 ENCOUNTER — Ambulatory Visit: Admitting: Student

## 2024-07-05 ENCOUNTER — Ambulatory Visit: Admitting: Student
